# Patient Record
Sex: Male | Born: 1965 | Race: White | Hispanic: No | Marital: Married | Smoking: Former smoker
Health system: Southern US, Community
[De-identification: ages and names within clinical notes are randomized; demographics above are authoritative.]

## PROBLEM LIST (undated history)

## (undated) DIAGNOSIS — Z720 Tobacco use: Secondary | ICD-10-CM

## (undated) DIAGNOSIS — E291 Testicular hypofunction: Secondary | ICD-10-CM

## (undated) DIAGNOSIS — T7840XA Allergy, unspecified, initial encounter: Secondary | ICD-10-CM

## (undated) DIAGNOSIS — N529 Male erectile dysfunction, unspecified: Secondary | ICD-10-CM

## (undated) DIAGNOSIS — R011 Cardiac murmur, unspecified: Secondary | ICD-10-CM

## (undated) DIAGNOSIS — I519 Heart disease, unspecified: Secondary | ICD-10-CM

## (undated) DIAGNOSIS — J45909 Unspecified asthma, uncomplicated: Secondary | ICD-10-CM

## (undated) DIAGNOSIS — E119 Type 2 diabetes mellitus without complications: Secondary | ICD-10-CM

## (undated) DIAGNOSIS — G4733 Obstructive sleep apnea (adult) (pediatric): Secondary | ICD-10-CM

## (undated) DIAGNOSIS — I10 Essential (primary) hypertension: Secondary | ICD-10-CM

## (undated) DIAGNOSIS — F419 Anxiety disorder, unspecified: Secondary | ICD-10-CM

## (undated) DIAGNOSIS — K219 Gastro-esophageal reflux disease without esophagitis: Secondary | ICD-10-CM

## (undated) HISTORY — DX: Cardiac murmur, unspecified: R01.1

## (undated) HISTORY — DX: Testicular hypofunction: E29.1

## (undated) HISTORY — DX: Unspecified asthma, uncomplicated: J45.909

## (undated) HISTORY — DX: Tobacco use: Z72.0

## (undated) HISTORY — DX: Gastro-esophageal reflux disease without esophagitis: K21.9

## (undated) HISTORY — DX: Essential (primary) hypertension: I10

## (undated) HISTORY — DX: Allergy, unspecified, initial encounter: T78.40XA

## (undated) HISTORY — DX: Heart disease, unspecified: I51.9

## (undated) HISTORY — PX: CARDIOVASCULAR STRESS TEST: SHX262

## (undated) HISTORY — DX: Anxiety disorder, unspecified: F41.9

## (undated) HISTORY — DX: Male erectile dysfunction, unspecified: N52.9

## (undated) HISTORY — DX: Obstructive sleep apnea (adult) (pediatric): G47.33

## (undated) HISTORY — DX: Type 2 diabetes mellitus without complications: E11.9

---

## 2001-11-06 ENCOUNTER — Emergency Department (HOSPITAL_COMMUNITY): Admission: EM | Admit: 2001-11-06 | Discharge: 2001-11-06 | Payer: Self-pay

## 2004-06-07 HISTORY — PX: US ECHOCARDIOGRAPHY: HXRAD669

## 2009-03-04 ENCOUNTER — Emergency Department (HOSPITAL_COMMUNITY): Admission: EM | Admit: 2009-03-04 | Discharge: 2009-03-04 | Payer: Self-pay | Admitting: Emergency Medicine

## 2011-01-14 ENCOUNTER — Emergency Department (HOSPITAL_COMMUNITY): Payer: No Typology Code available for payment source

## 2011-01-14 ENCOUNTER — Emergency Department (HOSPITAL_COMMUNITY)
Admission: EM | Admit: 2011-01-14 | Discharge: 2011-01-15 | Disposition: A | Discharge status: Home / Self Care | Payer: No Typology Code available for payment source | Attending: Emergency Medicine | Admitting: Emergency Medicine

## 2011-01-14 DIAGNOSIS — S139XXA Sprain of joints and ligaments of unspecified parts of neck, initial encounter: Secondary | ICD-10-CM | POA: Insufficient documentation

## 2011-01-14 DIAGNOSIS — IMO0002 Reserved for concepts with insufficient information to code with codable children: Secondary | ICD-10-CM | POA: Insufficient documentation

## 2011-01-14 DIAGNOSIS — Y92410 Unspecified street and highway as the place of occurrence of the external cause: Secondary | ICD-10-CM | POA: Insufficient documentation
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
## 2014-05-11 DIAGNOSIS — K2100 Gastro-esophageal reflux disease with esophagitis, without bleeding: Secondary | ICD-10-CM | POA: Insufficient documentation

## 2014-05-11 LAB — LIPID PANEL
Cholesterol: 122 mg/dL (ref 0–200)
HDL: 43 mg/dL (ref 35–70)
LDL Cholesterol: 73 mg/dL
LDL/HDL RATIO: 2.8
TRIGLYCERIDES: 57 mg/dL (ref 40–160)

## 2014-05-11 LAB — PSA: PSA: 0.48

## 2014-05-11 LAB — HEMOGLOBIN A1C: HEMOGLOBIN A1C: 5.1 % (ref 4.0–6.0)

## 2014-05-21 ENCOUNTER — Telehealth: Payer: Self-pay | Admitting: Cardiovascular Disease

## 2014-05-21 NOTE — Telephone Encounter (Signed)
Closed encounter °

## 2014-07-01 ENCOUNTER — Ambulatory Visit: Payer: Self-pay | Admitting: Cardiovascular Disease

## 2014-07-06 ENCOUNTER — Encounter: Payer: Self-pay | Admitting: Cardiovascular Disease

## 2014-07-06 ENCOUNTER — Ambulatory Visit (INDEPENDENT_AMBULATORY_CARE_PROVIDER_SITE_OTHER): Payer: Managed Care, Other (non HMO) | Admitting: Cardiovascular Disease

## 2014-07-06 VITALS — BP 130/93 | HR 77 | Resp 16 | Ht 72.0 in | Wt 280.4 lb

## 2014-07-06 DIAGNOSIS — R079 Chest pain, unspecified: Secondary | ICD-10-CM

## 2014-07-06 DIAGNOSIS — R002 Palpitations: Secondary | ICD-10-CM

## 2014-07-06 DIAGNOSIS — E669 Obesity, unspecified: Secondary | ICD-10-CM

## 2014-07-06 DIAGNOSIS — Z720 Tobacco use: Secondary | ICD-10-CM | POA: Insufficient documentation

## 2014-07-06 NOTE — Patient Instructions (Signed)
Your physician has requested that you have en exercise [facility]. For further information please visit [email or web address] Please follow instruction sheet, as given.  Dr. Royann Shiversroitoru recommends that you schedule a follow-up appointment in: AS NEEDED.

## 2014-07-06 NOTE — Progress Notes (Signed)
Patient ID: George Brock, male   DOB: 11/03/1965, 48 y.o.   MRN: [id number]     Reason for office visit Evaluation for chest pain  Link George Brock is a 48 year old man who is a long-time smoker and has a family history of CAD. He has recently developed chest discomfort that appears to occur during intense physical activity and improves with rest. It has been going on for couple of months. Symptoms are never severe, what are definitely noticeable compared to a year ago. He has no other cardiovascular complaints. He uses a sildenafil intermittently for erectile dysfunction. He does not have hypertension, diabetes mellitus or hypercholesterolemia. He previously successfully quit smoking by using a vape device, but went back to smoking cigarettes when his fianc abruptly left him.  He underwent a nuclear stress test 10 years ago that was a normal study. His echo was also normal at that time. At that time his baseline ECG was normal. Today's electrocardiogram shows subtle abnormalities. There is borderline voltage for LVH, the QRS is borderline prolonged at 104 ms and there is subtle ST segment depression and T-wave inversion in leads 1 and aVL. The precordial transition is delayed until lead V5, but there are no Q waves.  Allergies  Allergen Reactions  . Codeine Other (See Comments)    Stomach upset    Current Outpatient Prescriptions  Medication Sig Dispense Refill  . albuterol (PROVENTIL HFA;VENTOLIN HFA) 108 (90 BASE) MCG/ACT inhaler Inhale 1-2 puffs into the lungs as needed for wheezing or shortness of breath.      . ALPRAZolam (XANAX XR) 0.5 MG 24 hr tablet Take 0.5 mg by mouth as needed for anxiety.      . Cetirizine HCl (ZYRTEC ALLERGY PO) Take by mouth daily.      . pantoprazole (PROTONIX) 40 MG tablet Take 40 mg by mouth daily.      . sildenafil (VIAGRA) 25 MG tablet Take 25 mg by mouth as needed for erectile dysfunction.       No current facility-administered medications for this visit.     Past Medical History  Diagnosis Date  . Erectile dysfunction   . GERD (gastroesophageal reflux disease)   . Asthma   . Tobacco abuse     History reviewed. No pertinent past surgical history.  Family History  Problem Relation Age of Onset  . CAD Mother     History   Social History  . Marital Status: Divorced    Spouse Name: N/A    Number of Children: N/A  . Years of Education: N/A   Occupational History  . Not on file.   Social History Main Topics  . Smoking status: Current Some Day Smoker -- 1.00 packs/day    Types: Cigarettes  . Smokeless tobacco: Not on file  . Alcohol Use: 0.5 - 1.0 oz/week    1-2 drink(s) per week  . Drug Use: No  . Sexual Activity: Not on file   Other Topics Concern  . Not on file   Social History Narrative  . No narrative on file    Review of systems: The patient specifically denies any chest pain at rest, dyspnea at rest or with exertion, orthopnea, paroxysmal nocturnal dyspnea, syncope, palpitations, focal neurological deficits, intermittent claudication, lower extremity edema, unexplained weight gain, cough, hemoptysis or wheezing.  The patient also denies abdominal pain, nausea, vomiting, dysphagia, diarrhea, constipation, polyuria, polydipsia, dysuria, hematuria, frequency, urgency, abnormal bleeding or bruising, fever, chills, unexpected weight changes, mood swings, change in skin or hair  texture, change in voice quality, auditory or visual problems, allergic reactions or rashes, new musculoskeletal complaints other than usual "aches and pains".   PHYSICAL EXAM BP 130/93  Pulse 77  Resp 16  Ht 6' (1.829 m)  Wt 127.189 kg (280 lb 6.4 oz)  BMI 38.02 kg/m2 Check blood pressure was 140/88 mm Hg General: Alert, oriented x3, no distress Head: no evidence of trauma, PERRL, EOMI, no exophtalmos or lid lag, no myxedema, no xanthelasma; normal ears, nose and oropharynx Neck: normal jugular venous pulsations and no hepatojugular  reflux; brisk carotid pulses without delay and no carotid bruits Chest: clear to auscultation, no signs of consolidation by percussion or palpation, normal fremitus, symmetrical and full respiratory excursions Cardiovascular: normal position and quality of the apical impulse, regular rhythm, normal first and second heart sounds, no murmurs, rubs or gallops Abdomen: no tenderness or distention, no masses by palpation, no abnormal pulsatility or arterial bruits, normal bowel sounds, no hepatosplenomegaly Extremities: no clubbing, cyanosis or edema; 2+ radial, ulnar and brachial pulses bilaterally; 2+ right femoral, posterior tibial and dorsalis pedis pulses; 2+ left femoral, posterior tibial and dorsalis pedis pulses; no subclavian or femoral bruits Neurological: grossly nonfocal   EKG: Normal sinus rhythm, minor IVCD (QRS 104 ms), ST depression and T-wave inversion in leads 1 and aVL  Lipid Panel  No results found for this basename: chol,  trig,  hdl,  cholhdl,  vldl,  ldlcalc,  ldldirect    BMET No results found for this basename: na,  k,  cl,  co2,  glucose,  bun,  creatinine,  calcium,  gfrnonaa,  gfraa   The patient's reports that these labs were normal when checked in late August.  ASSESSMENT AND PLAN  Eddie has chest discomfort symptoms that could represent exertional angina, although there are not completely typical. He has an abnormal electrocardiogram, change from 2005. His coronary risk factor profile is low to intermediate risk. I've recommended that he have a stress nuclear myocardial perfusion study. Weight loss is recommended. Smoking cessation is very strongly recommended. His blood pressure is borderline and should be carefully monitored. Sodium restriction as recommended.  Orders Placed This Encounter  Procedures  . Myocardial Perfusion Imaging  . EKG 12-Lead   Meds ordered this encounter  Medications  . pantoprazole (PROTONIX) 40 MG tablet    Sig: Take 40 mg by mouth  daily.  . Cetirizine HCl (ZYRTEC ALLERGY PO)    Sig: Take by mouth daily.  Marland Kitchen. albuterol (PROVENTIL HFA;VENTOLIN HFA) 108 (90 BASE) MCG/ACT inhaler    Sig: Inhale 1-2 puffs into the lungs as needed for wheezing or shortness of breath.  . sildenafil (VIAGRA) 25 MG tablet    Sig: Take 25 mg by mouth as needed for erectile dysfunction.  Marland Kitchen. ALPRAZolam (XANAX XR) 0.5 MG 24 hr tablet    Sig: Take 0.5 mg by mouth as needed for anxiety.    Junious SilkROITORU,Numa Heatwole  Jesse Nosbisch, MD, [facility] [phone] office [phone] pager

## 2014-07-20 LAB — HM COLONOSCOPY

## 2014-07-27 ENCOUNTER — Telehealth: Payer: Self-pay

## 2014-07-27 ENCOUNTER — Encounter (HOSPITAL_COMMUNITY): Payer: Self-pay | Admitting: Pharmacy Technician

## 2014-07-27 ENCOUNTER — Ambulatory Visit (HOSPITAL_COMMUNITY): Payer: Managed Care, Other (non HMO) | Attending: Cardiovascular Disease | Admitting: Radiology

## 2014-07-27 ENCOUNTER — Ambulatory Visit (INDEPENDENT_AMBULATORY_CARE_PROVIDER_SITE_OTHER): Payer: Managed Care, Other (non HMO) | Admitting: Interventional Cardiology

## 2014-07-27 ENCOUNTER — Encounter: Payer: Self-pay | Admitting: Interventional Cardiology

## 2014-07-27 VITALS — BP 132/80 | HR 72 | Resp 16

## 2014-07-27 DIAGNOSIS — R9431 Abnormal electrocardiogram [ECG] [EKG]: Secondary | ICD-10-CM | POA: Insufficient documentation

## 2014-07-27 DIAGNOSIS — R9439 Abnormal result of other cardiovascular function study: Secondary | ICD-10-CM

## 2014-07-27 DIAGNOSIS — R002 Palpitations: Secondary | ICD-10-CM | POA: Insufficient documentation

## 2014-07-27 DIAGNOSIS — Z01812 Encounter for preprocedural laboratory examination: Secondary | ICD-10-CM

## 2014-07-27 DIAGNOSIS — R079 Chest pain, unspecified: Secondary | ICD-10-CM

## 2014-07-27 MED ORDER — TECHNETIUM TC 99M SESTAMIBI GENERIC - CARDIOLITE
11.0000 | Freq: Once | INTRAVENOUS | Status: AC | PRN
Start: 2014-07-27 — End: 2014-07-27
  Administered 2014-07-27: 11 via INTRAVENOUS

## 2014-07-27 MED ORDER — ASPIRIN EC 81 MG PO TBEC: 81.0000 mg | DELAYED_RELEASE_TABLET | Freq: Every day | ORAL | Status: DC

## 2014-07-27 MED ORDER — TECHNETIUM TC 99M SESTAMIBI GENERIC - CARDIOLITE
33.0000 | Freq: Once | INTRAVENOUS | Status: AC | PRN
  Administered 2014-07-27: 33 via INTRAVENOUS

## 2014-07-27 NOTE — Progress Notes (Signed)
Patient ID: George Brock, male   DOB: 09/29/1965, 48 y.o.   MRN: [id number]   Date: 07/27/2014 ID: George Hewdward G Oleksy, DOB 04/11/1966, MRN [id number] PCP: Herb GraysSPEAR, TAMMY, MD  Reason: Abnormal myocardial perfusion study  ASSESSMENT;  1. Abnormal myocardial perfusion study with both inferior and apical fixed perfusion defects 2. Exertional dyspnea, palpitations and limited exertional tolerance. In conjunction with the myocardial perfusion study, we need to exclude ischemic heart disease. 3. Cigarette smoking 4. Family history CAD  PLAN:  1. After reviewing the myocardial perfusion study performed earlier today (I am DOD for office), I spoke to the patient and we discussed the implications of the myocardial perfusion study. He could have diaphragm attenuation accounting for the inferior wall abnormality noted but apical defect likely represented a true perfusion abnormality. Given his risk factors (family history, obesity, tobacco use, and erectile dysfunction) I have recommended that he undergo diagnostic coronary angiography to define anatomy and determine if his exertional complaints are related to coronary artery disease. This is certainly suggestive by the abnormalities noted on the nuclear study.  2. Coronary angiography from the radial approach were discussed with the patient in detail including the risk of stroke, death, myocardial infarction, allergy, infection, bleeding, vessel injury, kidney injury, among other complications and accepted by the patient. He only have the procedure performed on Mondays. He is not willing to have the procedure done before next week. He will be set up to have the procedure with Dr. Daphene Jaegerom Kelly   SUBJECTIVE: George Hewdward G Kovich is a 48 y.o. male who is asymptomatic today. He just completed a stress Cardiolite study and images demonstrated apical and inferior wall perfusion abnormalities without appreciable ischemia. LV size and function appear normal. Because of  the abnormality, I was asked to see him in the stress lab. The patient is indeed asymptomatic post exercise. He walked greater than 7 minutes on the treadmill. I reviewed the note from Dr.Croitoru and he documented a history of chest discomfort which led to the perfusion study.. The patient does not describe chest discomfort today and states that it is not a consistent problem. He does admit to a sensation that his heart is racing associated with decreased exertional tolerance. The symptoms have been present for several months. Nothing has changed since he saw Dr. Salena Saner. He continues to smoke cigarettes. He denies orthopnea, PND, syncope, history of arrhythmia, and recurring episodes of chest pain.he denies claudication.   Allergies  Allergen Reactions  . Codeine Other (See Comments)    Stomach upset    Current Outpatient Prescriptions on File Prior to Visit  Medication Sig Dispense Refill  . albuterol (PROVENTIL HFA;VENTOLIN HFA) 108 (90 BASE) MCG/ACT inhaler Inhale 1-2 puffs into the lungs as needed for wheezing or shortness of breath.    . ALPRAZolam (XANAX XR) 0.5 MG 24 hr tablet Take 0.5 mg by mouth as needed for anxiety.    . Cetirizine HCl (ZYRTEC ALLERGY PO) Take 10 mg by mouth daily.     . pantoprazole (PROTONIX) 40 MG tablet Take 40 mg by mouth daily.    . sildenafil (VIAGRA) 25 MG tablet Take 25 mg by mouth as needed for erectile dysfunction.     No current facility-administered medications on file prior to visit.    Past Medical History  Diagnosis Date  . Erectile dysfunction   . GERD (gastroesophageal reflux disease)   . Asthma   . Tobacco abuse     No past surgical history on file.  History  Social History  . Marital Status: Divorced    Spouse Name: N/A    Number of Children: N/A  . Years of Education: N/A   Occupational History  . Not on file.   Social History Main Topics  . Smoking status: Current Some Day Smoker -- 1.00 packs/day    Types: Cigarettes  .  Smokeless tobacco: Not on file  . Alcohol Use: 0.5 - 1.0 oz/week    1-2 drink(s) per week  . Drug Use: No  . Sexual Activity: Not on file   Other Topics Concern  . Not on file   Social History Narrative  . No narrative on file    Family History  Problem Relation Age of Onset  . CAD Mother     ROS: continues to smoke heavily. He states that he takes doctors. He is willing to undergo evaluation. We discussed the appearance of this stress images he seems to understand the concern. No history of hypertension, hyperlipidemia, or other chronic illness. He does have erectile dysfunction. He uses Protonix for reflux.. Other systems negative for complaints.  OBJECTIVE: BP 130/70millimeters mercury, heart rate 68, obese, in no respiratory distress. General: No acute distress, an age is compatible with appearance HEENT: normal without jaundice or pallor Neck: JVD flat. Carotids 2+ and symmetric without bruits Chest: clear Cardiac: Murmur: none. Gallop: absent. Rhythm: normal. Other: normal Abdomen: Bruit: absent. Pulsation: absent Extremities: Edema: normal. Pulses: 2+ and symmetric Neuro: normal Psych: normal  ECG: performed as part of the stress test did not reveal any significant abnormality. 

## 2014-07-27 NOTE — Telephone Encounter (Signed)
Pt given Dr.Smith recommendation to start Asa 81mg  daily. Pt adv that cardiac cath is scheduled on 11/16 @7 :30am with Dr.Kelly @ [facility]. Pt will come to the office on 11/10 for pre cath labs. Pt given verbal pre-procedure instructions. Pt will pick up written instructions when he comes in for lab work. Pt verbalized understanding.

## 2014-07-27 NOTE — Patient Instructions (Signed)
Your physician recommends that you continue on your current medications as directed. Please refer to the Current Medication list given to you today.  Lab Today: Bmet, Cbc  Your physician has requested that you have a cardiac catheterization. Cardiac catheterization is used to diagnose and/or treat various heart conditions. Doctors may recommend this procedure for a number of different reasons. The most common reason is to evaluate chest pain. Chest pain can be a symptom of coronary artery disease (CAD), and cardiac catheterization can show whether plaque is narrowing or blocking your heart's arteries. This procedure is also used to evaluate the valves, as well as measure the blood flow and oxygen levels in different parts of your heart. For further information please visit [email or web address]. Please follow instruction sheet, as given.   

## 2014-07-27 NOTE — Progress Notes (Signed)
[facility] SITE [facility] NUCLEAR MED [address] [place], [place] [address] [phone]    Cardiology Nuclear Med Study  George Brock is a 48 y.o. male     MRN : [id number]     DOB: 02/17/1966  Procedure Date: 07/27/2014  Nuclear Med Background Indication for Stress Test:  Evaluation for Ischemia and Abnormal EKG History:  MPI: 10 yrs ago NL Cardiac Risk Factors: none  Symptoms:  Chest Pain and Palpitations   Nuclear Pre-Procedure Caffeine/Decaff Intake:  None NPO After: 9:30pm   Lungs:  clear O2 Sat: 97% on room air. IV 0.9% NS with Angio Cath:  22g  IV Site: R Hand  IV Started by:  Cathlyn Parsonsynthia Hasspacher, RN  Chest Size (in):  54" Cup Size: n/a  Height: 6' (1.829 m)  Weight:  282 lb (127.914 kg)  BMI:  Body mass index is 38.24 kg/(m^2). Tech Comments:  Pictures checked, DOD H.Katrinka BlazingSmith MD consulted about his test. He came and spoke with the patient about having a cardiac catherization. S.Williams EMTP    Nuclear Med Study 1 or 2 day study: 1 day  Stress Test Type:  Stress  Reading MD: n/a  Order Authorizing Provider:  Mihai Croitoru,MD  Resting Radionuclide: Technetium 9540m Sestamibi  Resting Radionuclide Dose: 11.0 mCi   Stress Radionuclide:  Technetium 3840m Sestamibi  Stress Radionuclide Dose: 33.0 mCi           Stress Protocol Rest HR: 64 Stress HR: 166  Rest BP: 118/80 Stress BP: 167/83  Exercise Time (min): 7:15 METS: 8.90   Predicted Max HR: 170 bpm % Max HR: 97.65 bpm Rate Pressure Product: [id number]   Dose of Adenosine (mg):  n/a Dose of Lexiscan: n/a mg  Dose of Atropine (mg): n/a Dose of Dobutamine: n/a mcg/kg/min (at max HR)  Stress Test Technologist: Milana NaSabrina Williams, EMT-P  Nuclear Technologist:  Kerby NoraElzbieta Kubak, CNMT     Rest Procedure:  Myocardial perfusion imaging was performed at rest 45 minutes following the intravenous administration of Technetium 2340m Sestamibi. Rest ECG: sinus bradycardia rate 59 with poor R-wave progression.  Stress  Procedure:  The patient exercised on the treadmill utilizing the Bruce Protocol for 7:15 minutes. The patient stopped due to sob, fatigue and denied any chest pain.  Technetium 5840m Sestamibi was injected at peak exercise and myocardial perfusion imaging was performed after a brief delay. Stress ECG: During stress, QRS complex slightly widened, same morphology/left bundle branch block like. No significant ST segment depression identified.  QPS Raw Data Images:  Normal; no motion artifact; normal heart/lung ratio. Stress Images:  there is decreased radiotracer uptake in the apical distribution seen at both rest and stress. There is also a fixed defect along the inferoseptal/basal inferoseptal region seen at both rest and stress, possible old infarct. No areas of ischemia identified. Rest Images:  as above Subtraction (SDS):  No evidence of ischemia. Transient Ischemic Dilatation (Normal <1.22):  0.94 Lung/Heart Ratio (Normal <0.45):  0.38  Quantitative Gated Spect Images QGS EDV:  137 ml QGS ESV:  63 ml  Impression Exercise Capacity:  Good exercise capacity. BP Response:  Normal blood pressure response. Clinical Symptoms:  shortness of breath ECG Impression:  No significant ST segment change suggestive of ischemia. Exercise-induced left bundle branch block noted. 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  LV Ejection Fraction: 54%.  LV Wall Motion:  NL LV Function; NL Wall Motion  Donato SchultzSKAINS, MARK, MD

## 2014-07-28 ENCOUNTER — Other Ambulatory Visit (INDEPENDENT_AMBULATORY_CARE_PROVIDER_SITE_OTHER): Payer: Managed Care, Other (non HMO)

## 2014-07-28 DIAGNOSIS — Z01812 Encounter for preprocedural laboratory examination: Secondary | ICD-10-CM

## 2014-07-29 LAB — BASIC METABOLIC PANEL
BUN: 14 mg/dL (ref 6–23)
CHLORIDE: 103 meq/L (ref 96–112)
CO2: 28 mEq/L (ref 19–32)
CREATININE: 0.9 mg/dL (ref 0.4–1.5)
Calcium: 9.3 mg/dL (ref 8.4–10.5)
GFR: 91.34 mL/min (ref >60.00)
Glucose, Bld: 83 mg/dL (ref 70–99)
POTASSIUM: 4.1 meq/L (ref 3.5–5.1)
Sodium: 136 mEq/L (ref 135–145)

## 2014-07-29 LAB — CBC WITH DIFFERENTIAL/PLATELET
Basophils Absolute: 0 10*3/uL (ref 0.0–0.1)
Basophils Relative: 0.4 % (ref 0.0–3.0)
EOS PCT: 4.2 % (ref 0.0–5.0)
Eosinophils Absolute: 0.3 10*3/uL (ref 0.0–0.7)
HEMATOCRIT: 44.7 % (ref 39.0–52.0)
Hemoglobin: 15.2 g/dL (ref 13.0–17.0)
Lymphocytes Relative: 28.6 % (ref 12.0–46.0)
Lymphs Abs: 2.1 10*3/uL (ref 0.7–4.0)
MCHC: 34.1 g/dL (ref 30.0–36.0)
MCV: 95.8 fl (ref 78.0–100.0)
MONO ABS: 0.5 10*3/uL (ref 0.1–1.0)
Monocytes Relative: 6.5 % (ref 3.0–12.0)
NEUTROS PCT: 60.3 % (ref 43.0–77.0)
Neutro Abs: 4.5 10*3/uL (ref 1.4–7.7)
Platelets: 201 10*3/uL (ref 150.0–400.0)
RBC: 4.67 Mil/uL (ref 4.22–5.81)
RDW: 13 % (ref 11.5–15.5)
WBC: 7.4 10*3/uL (ref 4.0–10.5)

## 2014-07-31 ENCOUNTER — Telehealth: Payer: Self-pay

## 2014-07-31 NOTE — Telephone Encounter (Signed)
Pt aware of lab results.labs ok. Pt verbalized understanding.

## 2014-07-31 NOTE — Telephone Encounter (Signed)
-----   Message from Lesleigh NoeHenry W Smith III, MD sent at 07/29/2014  6:37 PM EST ----- Labs are okay.

## 2014-08-03 ENCOUNTER — Encounter (HOSPITAL_COMMUNITY)
Admission: RE | Disposition: A | Discharge status: Home / Self Care | Payer: Self-pay | Source: Ambulatory Visit | Attending: Cardiovascular Disease

## 2014-08-03 ENCOUNTER — Ambulatory Visit (HOSPITAL_COMMUNITY)
Admission: RE | Admit: 2014-08-03 | Discharge: 2014-08-03 | Disposition: A | Discharge status: Home / Self Care | Payer: Managed Care, Other (non HMO) | Source: Ambulatory Visit | Attending: Cardiovascular Disease | Admitting: Cardiovascular Disease

## 2014-08-03 DIAGNOSIS — K219 Gastro-esophageal reflux disease without esophagitis: Secondary | ICD-10-CM | POA: Diagnosis not present

## 2014-08-03 DIAGNOSIS — F17210 Nicotine dependence, cigarettes, uncomplicated: Secondary | ICD-10-CM | POA: Insufficient documentation

## 2014-08-03 DIAGNOSIS — R9439 Abnormal result of other cardiovascular function study: Secondary | ICD-10-CM | POA: Insufficient documentation

## 2014-08-03 DIAGNOSIS — J45909 Unspecified asthma, uncomplicated: Secondary | ICD-10-CM | POA: Insufficient documentation

## 2014-08-03 DIAGNOSIS — Z8249 Family history of ischemic heart disease and other diseases of the circulatory system: Secondary | ICD-10-CM | POA: Insufficient documentation

## 2014-08-03 DIAGNOSIS — E669 Obesity, unspecified: Secondary | ICD-10-CM

## 2014-08-03 DIAGNOSIS — Z720 Tobacco use: Secondary | ICD-10-CM

## 2014-08-03 HISTORY — PX: LEFT HEART CATHETERIZATION WITH CORONARY ANGIOGRAM: SHX5451

## 2014-08-03 LAB — PROTIME-INR
INR: 1.01 (ref 0.00–1.49)
PROTHROMBIN TIME: 13.4 s (ref 11.6–15.2)

## 2014-08-03 SURGERY — LEFT HEART CATHETERIZATION WITH CORONARY ANGIOGRAM
Anesthesia: LOCAL

## 2014-08-03 MED ORDER — HEPARIN SODIUM (PORCINE) 1000 UNIT/ML IJ SOLN
INTRAMUSCULAR | Status: AC
  Filled 2014-08-03: qty 1

## 2014-08-03 MED ORDER — ASPIRIN 81 MG PO CHEW
CHEWABLE_TABLET | ORAL | Status: AC
Start: 2014-08-03 — End: 2014-08-03
  Administered 2014-08-03: 81 mg via ORAL
  Filled 2014-08-03: qty 1

## 2014-08-03 MED ORDER — SODIUM CHLORIDE 0.9 % IJ SOLN: 3.0000 mL | INTRAMUSCULAR | Status: DC | PRN

## 2014-08-03 MED ORDER — NITROGLYCERIN 1 MG/10 ML FOR IR/CATH LAB
INTRA_ARTERIAL | Status: AC
  Filled 2014-08-03: qty 10

## 2014-08-03 MED ORDER — ASPIRIN EC 81 MG PO TBEC: 81.0000 mg | DELAYED_RELEASE_TABLET | Freq: Every day | ORAL | Status: DC

## 2014-08-03 MED ORDER — LIDOCAINE HCL (PF) 1 % IJ SOLN
INTRAMUSCULAR | Status: AC
  Filled 2014-08-03: qty 30

## 2014-08-03 MED ORDER — ONDANSETRON HCL 4 MG/2ML IJ SOLN: 4.0000 mg | Freq: Four times a day (QID) | INTRAMUSCULAR | Status: DC | PRN

## 2014-08-03 MED ORDER — MIDAZOLAM HCL 2 MG/2ML IJ SOLN
INTRAMUSCULAR | Status: AC
  Filled 2014-08-03: qty 2

## 2014-08-03 MED ORDER — SODIUM CHLORIDE 0.9 % IJ SOLN: 3.0000 mL | Freq: Two times a day (BID) | INTRAMUSCULAR | Status: DC

## 2014-08-03 MED ORDER — SODIUM CHLORIDE 0.9 % IV SOLN: INTRAVENOUS | Status: DC

## 2014-08-03 MED ORDER — HEPARIN (PORCINE) IN NACL 2-0.9 UNIT/ML-% IJ SOLN
INTRAMUSCULAR | Status: AC
  Filled 2014-08-03: qty 1500

## 2014-08-03 MED ORDER — FENTANYL CITRATE 0.05 MG/ML IJ SOLN
INTRAMUSCULAR | Status: AC
  Filled 2014-08-03: qty 2

## 2014-08-03 MED ORDER — SODIUM CHLORIDE 0.9 % IV SOLN: 250.0000 mL | INTRAVENOUS | Status: DC | PRN

## 2014-08-03 MED ORDER — SODIUM CHLORIDE 0.9 % IV SOLN
INTRAVENOUS | Status: DC
  Administered 2014-08-03: 07:00:00 via INTRAVENOUS

## 2014-08-03 MED ORDER — ACETAMINOPHEN 325 MG PO TABS: 650.0000 mg | ORAL_TABLET | ORAL | Status: DC | PRN

## 2014-08-03 MED ORDER — VERAPAMIL HCL 2.5 MG/ML IV SOLN
INTRAVENOUS | Status: AC
  Filled 2014-08-03: qty 2

## 2014-08-03 MED ORDER — ASPIRIN 81 MG PO CHEW
81.0000 mg | CHEWABLE_TABLET | ORAL | Status: AC
  Administered 2014-08-03: 81 mg via ORAL

## 2014-08-03 NOTE — H&P (View-Only) (Signed)
Patient ID: George Brock, male   DOB: 09/29/1965, 48 y.o.   MRN: [id number]   Date: 07/27/2014 ID: George Brock, DOB 04/11/1966, MRN [id number] PCP: Herb GraysSPEAR, TAMMY, MD  Reason: Abnormal myocardial perfusion study  ASSESSMENT;  1. Abnormal myocardial perfusion study with both inferior and apical fixed perfusion defects 2. Exertional dyspnea, palpitations and limited exertional tolerance. In conjunction with the myocardial perfusion study, we need to exclude ischemic heart disease. 3. Cigarette smoking 4. Family history CAD  PLAN:  1. After reviewing the myocardial perfusion study performed earlier today (I am DOD for office), I spoke to the patient and we discussed the implications of the myocardial perfusion study. He could have diaphragm attenuation accounting for the inferior wall abnormality noted but apical defect likely represented a true perfusion abnormality. Given his risk factors (family history, obesity, tobacco use, and erectile dysfunction) I have recommended that he undergo diagnostic coronary angiography to define anatomy and determine if his exertional complaints are related to coronary artery disease. This is certainly suggestive by the abnormalities noted on the nuclear study.  2. Coronary angiography from the radial approach were discussed with the patient in detail including the risk of stroke, death, myocardial infarction, allergy, infection, bleeding, vessel injury, kidney injury, among other complications and accepted by the patient. He only have the procedure performed on Mondays. He is not willing to have the procedure done before next week. He will be set up to have the procedure with Dr. Daphene Jaegerom Kelly   SUBJECTIVE: George Brock is a 48 y.o. male who is asymptomatic today. He just completed a stress Cardiolite study and images demonstrated apical and inferior wall perfusion abnormalities without appreciable ischemia. LV size and function appear normal. Because of  the abnormality, I was asked to see him in the stress lab. The patient is indeed asymptomatic post exercise. He walked greater than 7 minutes on the treadmill. I reviewed the note from Dr.Croitoru and he documented a history of chest discomfort which led to the perfusion study.. The patient does not describe chest discomfort today and states that it is not a consistent problem. He does admit to a sensation that his heart is racing associated with decreased exertional tolerance. The symptoms have been present for several months. Nothing has changed since he saw Dr. Salena Saner. He continues to smoke cigarettes. He denies orthopnea, PND, syncope, history of arrhythmia, and recurring episodes of chest pain.he denies claudication.   Allergies  Allergen Reactions  . Codeine Other (See Comments)    Stomach upset    Current Outpatient Prescriptions on File Prior to Visit  Medication Sig Dispense Refill  . albuterol (PROVENTIL HFA;VENTOLIN HFA) 108 (90 BASE) MCG/ACT inhaler Inhale 1-2 puffs into the lungs as needed for wheezing or shortness of breath.    . ALPRAZolam (XANAX XR) 0.5 MG 24 hr tablet Take 0.5 mg by mouth as needed for anxiety.    . Cetirizine HCl (ZYRTEC ALLERGY PO) Take 10 mg by mouth daily.     . pantoprazole (PROTONIX) 40 MG tablet Take 40 mg by mouth daily.    . sildenafil (VIAGRA) 25 MG tablet Take 25 mg by mouth as needed for erectile dysfunction.     No current facility-administered medications on file prior to visit.    Past Medical History  Diagnosis Date  . Erectile dysfunction   . GERD (gastroesophageal reflux disease)   . Asthma   . Tobacco abuse     No past surgical history on file.  History  Social History  . Marital Status: Divorced    Spouse Name: N/A    Number of Children: N/A  . Years of Education: N/A   Occupational History  . Not on file.   Social History Main Topics  . Smoking status: Current Some Day Smoker -- 1.00 packs/day    Types: Cigarettes  .  Smokeless tobacco: Not on file  . Alcohol Use: 0.5 - 1.0 oz/week    1-2 drink(s) per week  . Drug Use: No  . Sexual Activity: Not on file   Other Topics Concern  . Not on file   Social History Narrative  . No narrative on file    Family History  Problem Relation Age of Onset  . CAD Mother     ROS: continues to smoke heavily. He states that he takes doctors. He is willing to undergo evaluation. We discussed the appearance of this stress images he seems to understand the concern. No history of hypertension, hyperlipidemia, or other chronic illness. He does have erectile dysfunction. He uses Protonix for reflux.. Other systems negative for complaints.  OBJECTIVE: BP 130/1770millimeters mercury, heart rate 68, obese, in no respiratory distress. General: No acute distress, an age is compatible with appearance HEENT: normal without jaundice or pallor Neck: JVD flat. Carotids 2+ and symmetric without bruits Chest: clear Cardiac: Murmur: none. Gallop: absent. Rhythm: normal. Other: normal Abdomen: Bruit: absent. Pulsation: absent Extremities: Edema: normal. Pulses: 2+ and symmetric Neuro: normal Psych: normal  ECG: performed as part of the stress test did not reveal any significant abnormality.

## 2014-08-03 NOTE — Interval H&P Note (Signed)
Cath Lab Visit (complete for each Cath Lab visit)  Clinical Evaluation Leading to the Procedure:   ACS: No.  Non-ACS:    Anginal Classification: CCS II  Anti-ischemic medical therapy: No Therapy  Non-Invasive Test Results: Intermediate-risk stress test findings: cardiac mortality 1-3%/year  Prior CABG: No previous CABG      History and Physical Interval Note:  08/03/2014 7:52 AM  George Brock  has presented today for surgery, with the diagnosis of abnormal nuc  The various methods of treatment have been discussed with the patient and family. After consideration of risks, benefits and other options for treatment, the patient has consented to  Procedure(s): LEFT HEART CATHETERIZATION WITH CORONARY ANGIOGRAM (N/A) as a surgical intervention .  The patient's history has been reviewed, patient examined, no change in status, stable for surgery.  I have reviewed the patient's chart and labs.  Questions were answered to the patient's satisfaction.     KELLY,THOMAS A

## 2014-08-03 NOTE — CV Procedure (Signed)
George Brock is a 48 y.o. male   [id number]  [id number] LOCATION:  FACILITY: [facility]  PHYSICIAN: Lennette Biharihomas A. Kelly, MD, [facility] 06/22/1966   DATE OF PROCEDURE:  08/03/2014     CARDIAC CATHETERIZATION    HISTORY:    [name] is a 48 year old male who has a long-standing history of tobacco use, recent development of palpitations and increasing fatigue who had undergone nuclear perfusion imaging which revealed both inferior and apical fixed perfusion defects.  In light of his cardiac risk factor profile he is now referred for definitive diagnostic cardiac catheterization and possible percutaneous coronary intervention.   PROCEDURE:  Left heart catheterization via the right radial artery: Coronary angiography, left ventriculography.  The patient was brought to the second floor [facility] cath lab in the fasting state. The patient was premedicated with Versed 3 mg and fentanyl 75 mcg. A right radial approach was utilized after an Allen's test verified adequate circulation. The right radial artery was punctured via the Seldinger technique, and a 6 Jamaica[place] Glidesheath Slender was inserted without difficulty.  A radial cocktail consisting of Verapamil, IV nitroglycerin, and lidocaine was administered. Weight adjusted heparin was administered. A safety J wire was advanced into the ascending aorta. Diagnostic catheterization was done with 5 [place]French JR 4 and TIG 4.0 catheters. A 5 French pigtail catheter was used for left ventriculography. A TR radial band was applied for hemostasis. The patient left the catheterization laboratory in stable condition.   HEMODYNAMICS:   Central Aorta: 100/67  Left Ventricle: 100/9  ANGIOGRAPHY:   The left main coronary artery was a very short  angiographically normal vessel that immediately bifurcated into the LAD and left circumflex coronary artery.   The LAD was angiographically normal and gave rise to 2 major diagonal vessels and several septal  perforating arteries. The vessel extended to the LV apex.   The left circumflex coronary artery was a large dominant normal vessel which gave rise to two obtuse marginal branch.   The RCA was an angiographically normal nondominant vessel.  Left ventriculography revealed normal global LV contractility without focal segmental wall motion abnormalities. There was no evidence for mitral regurgitation. The ejection fraction was 55-60%.   Total contrast used: 80 cc Omnipaque  IMPRESSION:  Normal coronary arteries.  Normal left ventricular function.   RECOMMENDATION:  Smoking cessation was strongly advised.   Lennette Biharihomas A. Kelly, MD, [facility] 08/03/2014 8:41 AM

## 2014-08-03 NOTE — Discharge Instructions (Signed)
Radial Site Care Refer to this sheet in the next few weeks. These instructions provide you with information on caring for yourself after your procedure. Your caregiver may also give you more specific instructions. Your treatment has been planned according to current medical practices, but problems sometimes occur. Call your caregiver if you have any problems or questions after your procedure. HOME CARE INSTRUCTIONS  You may shower the day after the procedure.Remove the bandage (dressing) and gently wash the site with plain soap and water.Gently pat the site dry.  Do not apply powder or lotion to the site.  Do not submerge the affected site in water for 3 to 5 days.  Inspect the site at least twice daily.  Do not flex or bend the affected arm for 24 hours.  No lifting over 5 pounds (2.3 kg) for 5 days after your procedure.  Do not drive home if you are discharged the same day of the procedure. Have someone else drive you.  You may drive 24 hours after the procedure unless otherwise instructed by your caregiver.  Do not operate machinery or power tools for 24 hours.  A responsible adult should be with you for the first 24 hours after you arrive home. What to expect:  Any bruising will usually fade within 1 to 2 weeks.  Blood that collects in the tissue (hematoma) may be painful to the touch. It should usually decrease in size and tenderness within 1 to 2 weeks. SEEK IMMEDIATE MEDICAL CARE IF:  You have unusual pain at the radial site.  You have redness, warmth, swelling, or pain at the radial site.  You have drainage (other than a small amount of blood on the dressing).  You have chills.  You have a fever or persistent symptoms for more than 72 hours.  You have a fever and your symptoms suddenly get worse.  Your arm becomes pale, cool, tingly, or numb.  You have heavy bleeding from the site. Hold pressure on the site. Document Released: 10/07/2010 Document Revised:  11/27/2011 Document Reviewed: 10/07/2010 [facility] Patient Information 2015 [place], [place]. This information is not intended to replace advice given to you by your health care provider. Make sure you discuss any questions you have with your health care provider.                 Return To Work _[name]__ was treated at our facility. INJURY OR ILLNESS WAS: _____ Work-related __X___ Not work-related _____ Undetermined if work-related RETURN TO WORK  Employee may return to work on: Monday 08/10/2014  Employee may return to modified work on: ____________________ WORK ACTIVITY RESTRICTIONS Work activities not tolerated include: _____ Bending _____ Prolonged sitting _____ Lifting _____ Squatting _____ Prolonged standing _____ Otelia Limeslimbing _____ Reaching _____ Pushing and pulling _____ Walking _____ Other ____________________ Show this Return to Work statement to Proofreaderyour supervisor at work as soon as possible. Your employer should be aware of your condition and can help with the necessary work activity restrictions. If you wish to return to work sooner than the date above, or if you have further problems which make it difficult for you to return at that time, please call us or your caregiver. __Dr. Maisie Fushomas Kelly____ Physician Name (Printed) _________________________________________ Physician Signature  _11/16/2015___ Date Document Released: 09/04/2005 Document Revised: 11/27/2011 Document Reviewed: 02/19/2007 [facility] RoadExitCare Patient Information 2015 [place], [place]. This information is not intended to replace advice given to you by your health care provider. Make sure you discuss any questions you have with your  health care provider. ° ° °

## 2014-08-04 ENCOUNTER — Telehealth: Payer: Self-pay | Admitting: Cardiovascular Disease

## 2014-08-04 NOTE — Telephone Encounter (Signed)
Patient stated he was wanting to know why Myoview abnormal and cardiac cath normal.Advised myoview indicated he might have a blockage.Exercised induced ekg showed a LBBB.Follow up cath appointment scheduled with Dr.Croitoru 08/17/14 at 4:15 pm.

## 2014-08-04 NOTE — Telephone Encounter (Signed)
Pt had cath yesterday,says he have some questions.

## 2014-08-04 NOTE — Telephone Encounter (Signed)
Returned Call .Marland Kitchen. Please call .Marland Kitchen.   Thanks

## 2014-08-04 NOTE — Telephone Encounter (Signed)
Returned call to patient he stated he had a cardiac cath yesterday 08/03/14 and cath was normal.Stated he was told myoview abnormal and he need a cath.Patient was told myoview ekg rev

## 2014-08-04 NOTE — Telephone Encounter (Signed)
Returned call to patient no answer.LMTC. 

## 2014-08-11 ENCOUNTER — Encounter: Payer: Self-pay | Admitting: *Deleted

## 2014-08-17 ENCOUNTER — Encounter: Payer: Self-pay | Admitting: Cardiovascular Disease

## 2014-08-17 ENCOUNTER — Ambulatory Visit (INDEPENDENT_AMBULATORY_CARE_PROVIDER_SITE_OTHER): Payer: Managed Care, Other (non HMO) | Admitting: Cardiovascular Disease

## 2014-08-17 VITALS — BP 132/102 | HR 80 | Ht 72.0 in | Wt 287.7 lb

## 2014-08-17 DIAGNOSIS — R9439 Abnormal result of other cardiovascular function study: Secondary | ICD-10-CM

## 2014-08-17 DIAGNOSIS — Z720 Tobacco use: Secondary | ICD-10-CM

## 2014-08-17 NOTE — Progress Notes (Signed)
Patient ID: George Brock, male   DOB: 01/06/1966, 48 y.o.   MRN: [id number]     Reason for office visit Follow-up after cardiac catheterization  Since I last saw George Brock in clinic, he underwent a treadmill exercise stress test with nuclear imaging. He developed a rate related intraventricular conduction delay, consistent with an atypical LBBB during exercise. The nuclear perfusion images were markedly abnormal with fixed defects seen along the inferior septum and the apical septum. He had an ejection fraction of 54% with normal wall motion. He subsequently underwent coronary angiography that showed normal coronary arteries. He is continuing his efforts to quit smoking, down to 4 cigarettes a day, substituting it with a vape. He feels well   Allergies  Allergen Reactions  . Codeine Other (See Comments)    Stomach upset    Current Outpatient Prescriptions  Medication Sig Dispense Refill  . albuterol (PROVENTIL HFA;VENTOLIN HFA) 108 (90 BASE) MCG/ACT inhaler Inhale 1-2 puffs into the lungs as needed for wheezing or shortness of breath.    . ALPRAZolam (XANAX XR) 0.5 MG 24 hr tablet Take 0.5 mg by mouth as needed for anxiety.    Marland Kitchen. aspirin EC 81 MG tablet Take 1 tablet (81 mg total) by mouth daily.    . Cetirizine HCl (ZYRTEC ALLERGY PO) Take 10 mg by mouth daily.     . pantoprazole (PROTONIX) 40 MG tablet Take 40 mg by mouth daily.    . sildenafil (VIAGRA) 25 MG tablet Take 25 mg by mouth as needed for erectile dysfunction.     No current facility-administered medications for this visit.    Past Medical History  Diagnosis Date  . Erectile dysfunction   . GERD (gastroesophageal reflux disease)   . Asthma   . Tobacco abuse     Past Surgical History  Procedure Laterality Date  . [place] echocardiography  06/07/2004  . Cardiovascular stress test      cardiolite    Family History  Problem Relation Age of Onset  . CAD Mother     History   Social History  . Marital Status: Divorced     Spouse Name: N/A    Number of Children: N/A  . Years of Education: N/A   Occupational History  . Not on file.   Social History Main Topics  . Smoking status: Current Some Day Smoker -- 1.00 packs/day    Types: Cigarettes  . Smokeless tobacco: Not on file  . Alcohol Use: 0.5 - 1.0 oz/week    1-2 drink(s) per week  . Drug Use: No  . Sexual Activity: Not on file   Other Topics Concern  . Not on file   Social History Narrative    Review of systems: The patient specifically denies any chest pain at rest or with exertion, dyspnea at rest or with exertion, orthopnea, paroxysmal nocturnal dyspnea, syncope, palpitations, focal neurological deficits, intermittent claudication, lower extremity edema, unexplained weight gain, cough, hemoptysis or wheezing.  The patient also denies abdominal pain, nausea, vomiting, dysphagia, diarrhea, constipation, polyuria, polydipsia, dysuria, hematuria, frequency, urgency, abnormal bleeding or bruising, fever, chills, unexpected weight changes, mood swings, change in skin or hair texture, change in voice quality, auditory or visual problems, allergic reactions or rashes, new musculoskeletal complaints other than usual "aches and pains".   PHYSICAL EXAM BP 132/102 mmHg  Pulse 80  Ht 6' (1.829 m)  Wt 287 lb 11.2 oz (130.5 kg)  BMI 39.01 kg/m2  General: Alert, oriented x3, no distress Head: no evidence  of trauma, PERRL, EOMI, no exophtalmos or lid lag, no myxedema, no xanthelasma; normal ears, nose and oropharynx Neck: normal jugular venous pulsations and no hepatojugular reflux; brisk carotid pulses without delay and no carotid bruits Chest: clear to auscultation, no signs of consolidation by percussion or palpation, normal fremitus, symmetrical and full respiratory excursions Cardiovascular: normal position and quality of the apical impulse, regular rhythm, normal first and second heart sounds, no murmurs, rubs or gallops Abdomen: no tenderness or  distention, no masses by palpation, no abnormal pulsatility or arterial bruits, normal bowel sounds, no hepatosplenomegaly Extremities: no clubbing, cyanosis or edema; 2+ radial, ulnar and brachial pulses bilaterally; 2+ right femoral, posterior tibial and dorsalis pedis pulses; 2+ left femoral, posterior tibial and dorsalis pedis pulses; no subclavian or femoral bruits Neurological: grossly nonfocal     BMET    Component Value Date/Time   NA 136 07/28/2014 1600   K 4.1 07/28/2014 1600   CL 103 07/28/2014 1600   CO2 28 07/28/2014 1600   GLUCOSE 83 07/28/2014 1600   BUN 14 07/28/2014 1600   CREATININE 0.9 07/28/2014 1600   CALCIUM 9.3 07/28/2014 1600     ASSESSMENT AND PLAN  George Brock had a "false positive" nuclear stress test related to exercise-induced rate related left bundle branch block with a fairly typical septal artifact. He has angiographically normal coronary arteries. I congratulated him on his efforts to achieve complete smoking cessation. We discussed the fact that the new nicotine vape products have been on certain safety records and that there is no true scientific data on using them for smoking cessation. However I think it makes sense to continue trying to wean off cigarettes altogether and then discontinue all nicotine products.  Regular exercise and smoking cessation is strongly recommended. He will follow-up as needed.  Junious SilkROITORU,Jakim Drapeau  Paulett Kaufhold, MD, [facility] [phone] office [phone] pager

## 2014-08-17 NOTE — Patient Instructions (Signed)
Dr. Croitoru recommends that you schedule a follow-up appointment in: AS NEEDED   

## 2014-08-27 ENCOUNTER — Encounter (HOSPITAL_COMMUNITY): Payer: Self-pay | Admitting: Cardiovascular Disease

## 2015-06-01 ENCOUNTER — Encounter: Payer: Self-pay | Admitting: Family Medicine

## 2015-06-01 DIAGNOSIS — K2100 Gastro-esophageal reflux disease with esophagitis, without bleeding: Secondary | ICD-10-CM

## 2015-06-01 DIAGNOSIS — R011 Cardiac murmur, unspecified: Secondary | ICD-10-CM | POA: Insufficient documentation

## 2015-06-01 DIAGNOSIS — N529 Male erectile dysfunction, unspecified: Secondary | ICD-10-CM

## 2015-06-01 DIAGNOSIS — J4520 Mild intermittent asthma, uncomplicated: Secondary | ICD-10-CM

## 2015-06-01 DIAGNOSIS — K219 Gastro-esophageal reflux disease without esophagitis: Secondary | ICD-10-CM | POA: Insufficient documentation

## 2015-06-01 DIAGNOSIS — F419 Anxiety disorder, unspecified: Secondary | ICD-10-CM | POA: Insufficient documentation

## 2015-06-01 DIAGNOSIS — T7840XA Allergy, unspecified, initial encounter: Secondary | ICD-10-CM | POA: Insufficient documentation

## 2015-06-07 ENCOUNTER — Ambulatory Visit (INDEPENDENT_AMBULATORY_CARE_PROVIDER_SITE_OTHER): Payer: Managed Care, Other (non HMO) | Admitting: Physician Assistant

## 2015-06-07 ENCOUNTER — Encounter: Payer: Self-pay | Admitting: Physician Assistant

## 2015-06-07 VITALS — BP 144/80 | HR 64 | Temp 98.6°F | Resp 18 | Ht 70.75 in | Wt 290.0 lb

## 2015-06-07 DIAGNOSIS — F419 Anxiety disorder, unspecified: Secondary | ICD-10-CM | POA: Diagnosis not present

## 2015-06-07 DIAGNOSIS — Z720 Tobacco use: Secondary | ICD-10-CM | POA: Diagnosis not present

## 2015-06-07 DIAGNOSIS — K2100 Gastro-esophageal reflux disease with esophagitis, without bleeding: Secondary | ICD-10-CM

## 2015-06-07 DIAGNOSIS — J309 Allergic rhinitis, unspecified: Secondary | ICD-10-CM | POA: Insufficient documentation

## 2015-06-07 DIAGNOSIS — J302 Other seasonal allergic rhinitis: Secondary | ICD-10-CM

## 2015-06-07 DIAGNOSIS — N529 Male erectile dysfunction, unspecified: Secondary | ICD-10-CM

## 2015-06-07 DIAGNOSIS — B009 Herpesviral infection, unspecified: Secondary | ICD-10-CM | POA: Diagnosis not present

## 2015-06-07 DIAGNOSIS — N528 Other male erectile dysfunction: Secondary | ICD-10-CM

## 2015-06-07 DIAGNOSIS — J4520 Mild intermittent asthma, uncomplicated: Secondary | ICD-10-CM | POA: Diagnosis not present

## 2015-06-07 DIAGNOSIS — E669 Obesity, unspecified: Secondary | ICD-10-CM

## 2015-06-07 NOTE — Progress Notes (Addendum)
Patient ID: George Brock MRN: [id number], DOB: 1965/12/15, 49 y.o. Date of Encounter: @  Chief Complaint:  Chief Complaint  Patient presents with  . new pt est care    not due CPE    HPI: 49 y.o. year old white male  presents as a new patient to establish care.  I REVIEWED ALL MEDICAL RECORDS FROM [facility]-----ALL INFORMATION DOCUMENTED IN TODAY'S NOTE:  Patient was going to [facility] but because that clinic is closing, he is here to establish care.  Says that he sees no other medical providers other than Cardiology and going to [facility]. Says that he had gone to [facility] for 16 years.  He had a false positive Cardiolite stress test. Underwent cardiac catheterization with Dr. Daphene Jaeger 08/03/2014 which was normal.  Says that Dr. Tresa Endo did tell him to start a baby aspirin daily. However says " I don't know why---- my blood pressure is good, my cholesterol is good. I know I'm overweight and I do smoke but..-- " Says he  "does not eat right" and he does not exercise except for the activity at work.  Says that he smokes one half pack per day and does not want to quit. Says that in the past Dr. Yehuda Budd discussed Chantix but once he read the side effects there was no way he was going to take that. Says that he is aware that smoking is bad for his health but he does not want to quit. Says that he believes in God and knows that he has to die from something.  Says that he takes the Protonix every day. His controls his GERD.  Says he usually has to go in to the PCP office in the spring and the fall. Says usually prescribe Z-Pak albuterol and Zyrtec-D and this works well.  Says that he had a colonoscopy at age 66 and that was normal.  No complaints or concerns to address today. Says that he is just here for "a meet and greet".  Says that the Xanax was prescribed when he was with a prior job. Says that since he has had a job change it is not as stressful and now he  only uses Xanax 1 or 2 per month.  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 Says that apparently his  "last girlfriend gave him that given the keeps giving."  I was reviewing his notes from [facility] note dated 03/15/15. 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 According to that office note patient reported he had never had an HSV-2 outbreak. They explained what symptoms he should monitor for and when to start acyclovir. Patient education was given and steps were given regarding preventing exposure to partner.  Also their office note dated 05/03/15--- that note was for genital warts. That note stated that he presented to clinic for cryotherapy to lesion on penis and testicles. Patient reported that he had had them for quite a while and was noticing that he seemed to be getting more lesions. He had tried over-the-counter topicals with no effect. At that visit they did apply cryotherapy. Was informed that if wart not completely resolved in 2-3 weeks to return for another prior treatment.  Last set of labs that I see was from 05/11/14. At that time he had the following: CBC normal CMET all normal Lipid profile with total cholesterol 122, triglycerides 57, HDL 43, LDL 73 Hemoglobin W0J 5.1  PSA 0.48 Microalbumin negative  Labs 03/09/15 showed: RPR nonreactive Hepatitis panel negative Urine gonorrhea chlamydia negative HSV antibodies were positive for type I and also positive for type II HIV was nonreactive/negative  Colonoscopy was performed at [facility] by Dr. Charlott Rakes on 07/20/2014. Showed diverticulosis in the sigmoid colon. Internal hemorrhoids. Repeat colonoscopy in 10 years for screening purposes.   Past Medical History  Diagnosis Date  . Erectile dysfunction   . GERD (gastroesophageal reflux disease)   . Asthma   . Tobacco abuse   . Allergy     seasonal  . Anxiety     work related  . Heart murmur    HSV  2  Home Meds: Outpatient Prescriptions Prior to Visit  Medication Sig Dispense Refill  . acyclovir (ZOVIRAX) 400 MG tablet Take 400 mg by mouth as needed.    Marland Kitchen albuterol (PROVENTIL HFA;VENTOLIN HFA) 108 (90 BASE) MCG/ACT inhaler Inhale 1-2 puffs into the lungs as needed for wheezing or shortness of breath.    . ALPRAZolam (XANAX XR) 0.5 MG 24 hr tablet Take 0.5 mg by mouth as needed for anxiety.    Marland Kitchen aspirin EC 81 MG tablet Take 1 tablet (81 mg total) by mouth daily.    . Cetirizine HCl (ZYRTEC ALLERGY PO) Take 10 mg by mouth daily.     . pantoprazole (PROTONIX) 40 MG tablet Take 40 mg by mouth daily.    . sildenafil (VIAGRA) 25 MG tablet Take 25 mg by mouth as needed for erectile dysfunction.     No facility-administered medications prior to visit.    Allergies:  Allergies  Allergen Reactions  . Codeine Other (See Comments)    Stomach upset    Social History   Social History  . Marital Status: Divorced    Spouse Name: N/A  . Number of Children: N/A  . Years of Education: N/A   Occupational History  . Not on file.   Social History Main Topics  . Smoking status: Current Some Day Smoker -- 0.50 packs/day    Types: Cigarettes  . Smokeless tobacco: Not on file  . Alcohol Use: 1.8 - 2.4 oz/week    1-2 Standard drinks or equivalent, 2 Glasses of wine per week  . Drug Use: No  . Sexual Activity: Yes    Birth Control/ Protection: None   Other Topics Concern  . Not on file   Social History Narrative    Family History  Problem Relation Age of Onset  . CAD Mother   . Diabetes Mother   . Heart disease Mother   . Cancer Father   . Hypertension Father      Review of Systems:  See HPI for pertinent ROS. All other ROS negative.    Physical Exam: Blood pressure 144/80, pulse 64, temperature 98.6 F (37 C), temperature source Oral, resp. rate 18, height 5' 10.75" (1.797 m), weight 290 lb (131.543 kg)., Body mass index is 40.74 kg/(m^2). General: Obese white male .  Appears in no acute distress. Neck: Supple. No thyromegaly. No lymphadenopathy. Lungs: Clear bilaterally to auscultation without wheezes, rales, or rhonchi. Breathing is unlabored. Heart: RRR with S1 S2. No murmurs, rubs, or gallops. Abdomen: Soft, non-tender, non-distended with normoactive bowel sounds. No hepatomegaly. No rebound/guarding. No obvious abdominal masses. Musculoskeletal:  Strength and tone normal for age. Extremities/Skin: Warm and dry.  No edema. No rashes or suspicious lesions. Neuro: Alert and oriented X 3. Moves all extremities spontaneously. Gait is normal. CNII-XII grossly in tact.  Psych:  Responds to questions appropriately with a normal affect.     ASSESSMENT AND PLAN:  49 y.o. year old male with  1. Tobacco abuse He is not interested in quitting.  2. Anxiety This is controlled since he has changed his job he only uses Xanax 1 or 2 times a month.  3. Impotence of organic origin He uses Viagra as needed for this.  4. Gastroesophageal reflux disease with esophagitis He takes Protonix daily for this. Symptoms are controlled with the Protonix.  5. Other seasonal allergic rhinitis He uses Zyrtec for this. Says that he usually has a flare in spring and fall.  6. Obesity (BMI 35.0-39.9 without comorbidity) He is aware that he has a poor diet and is aware of proper diet. He is aware that he does not do appropriate exercise but is not interested in doing this.  7. Mild intermittent asthma in adult without complication He states that in the spring and fall when his allergies flare, his asthma flares and he has to use albuterol at that time.  He can wait to schedule a complete physical exam once it has been a full year and next physical is due. As well follow-up if needed.   Signed, [address] [place], [place], [facility] 06/07/2015 11:52 AM

## 2015-09-06 ENCOUNTER — Other Ambulatory Visit: Payer: Self-pay | Admitting: Family Medicine

## 2015-09-06 ENCOUNTER — Ambulatory Visit (INDEPENDENT_AMBULATORY_CARE_PROVIDER_SITE_OTHER): Payer: Managed Care, Other (non HMO) | Admitting: Physician Assistant

## 2015-09-06 ENCOUNTER — Encounter: Payer: Self-pay | Admitting: Physician Assistant

## 2015-09-06 VITALS — BP 132/92 | HR 76 | Temp 98.3°F | Resp 18 | Ht 71.0 in | Wt 297.0 lb

## 2015-09-06 DIAGNOSIS — Z0000 Encounter for general adult medical examination without abnormal findings: Secondary | ICD-10-CM

## 2015-09-06 DIAGNOSIS — J988 Other specified respiratory disorders: Secondary | ICD-10-CM

## 2015-09-06 DIAGNOSIS — B9689 Other specified bacterial agents as the cause of diseases classified elsewhere: Secondary | ICD-10-CM

## 2015-09-06 LAB — CBC WITH DIFFERENTIAL/PLATELET
BASOS PCT: 0 % (ref 0–1)
Basophils Absolute: 0 10*3/uL (ref 0.0–0.1)
Eosinophils Absolute: 0.3 10*3/uL (ref 0.0–0.7)
Eosinophils Relative: 3 % (ref 0–5)
HEMATOCRIT: 46.3 % (ref 39.0–52.0)
HEMOGLOBIN: 16.3 g/dL (ref 13.0–17.0)
LYMPHS PCT: 20 % (ref 12–46)
Lymphs Abs: 1.7 10*3/uL (ref 0.7–4.0)
MCH: 32.5 pg (ref 26.0–34.0)
MCHC: 35.2 g/dL (ref 30.0–36.0)
MCV: 92.4 fL (ref 78.0–100.0)
MONOS PCT: 7 % (ref 3–12)
MPV: 9.2 fL (ref 8.6–12.4)
Monocytes Absolute: 0.6 10*3/uL (ref 0.1–1.0)
Neutro Abs: 6 10*3/uL (ref 1.7–7.7)
Neutrophils Relative %: 70 % (ref 43–77)
Platelets: 227 10*3/uL (ref 150–400)
RBC: 5.01 MIL/uL (ref 4.22–5.81)
RDW: 12.9 % (ref 11.5–15.5)
WBC: 8.5 10*3/uL (ref 4.0–10.5)

## 2015-09-06 LAB — COMPLETE METABOLIC PANEL WITH GFR
ALT: 20 U/L (ref 9–46)
AST: 17 U/L (ref 10–35)
Albumin: 4 g/dL (ref 3.6–5.1)
Alkaline Phosphatase: 62 U/L (ref 40–115)
BUN: 14 mg/dL (ref 7–25)
CALCIUM: 8.8 mg/dL (ref 8.6–10.3)
CO2: 25 mmol/L (ref 20–31)
Chloride: 103 mmol/L (ref 98–110)
Creat: 0.84 mg/dL (ref 0.70–1.33)
GFR, Est African American: >89 mL/min (ref >=60)
GFR, Est Non African American: >89 mL/min (ref >=60)
GLUCOSE: 83 mg/dL (ref 70–99)
Potassium: 4.4 mmol/L (ref 3.5–5.3)
SODIUM: 137 mmol/L (ref 135–146)
TOTAL PROTEIN: 6.7 g/dL (ref 6.1–8.1)
Total Bilirubin: 0.6 mg/dL (ref 0.2–1.2)

## 2015-09-06 LAB — LIPID PANEL
Cholesterol: 118 mg/dL — ABNORMAL LOW (ref 125–200)
HDL: 36 mg/dL — ABNORMAL LOW (ref >=40)
LDL Cholesterol: 71 mg/dL (ref <130)
Total CHOL/HDL Ratio: 3.3 Ratio (ref <=5.0)
Triglycerides: 57 mg/dL (ref <150)
VLDL: 11 mg/dL (ref <30)

## 2015-09-06 LAB — TSH: TSH: 1.486 u[IU]/mL (ref 0.350–4.500)

## 2015-09-06 MED ORDER — AZITHROMYCIN 250 MG PO TABS: ORAL_TABLET | ORAL | Status: DC

## 2015-09-06 MED ORDER — PANTOPRAZOLE SODIUM 40 MG PO TBEC: 40.0000 mg | DELAYED_RELEASE_TABLET | Freq: Every day | ORAL | Status: DC

## 2015-09-06 NOTE — Progress Notes (Signed)
Patient ID: George Brock MRN: [id number], DOB: [birth year] 49 y.o. Date of Encounter: 09/06/2015, 9:11 AM    Chief Complaint: Physical (CPE)  HPI: 49 y.o. y/o male here for CPE.   He has had congestion for past 3 weeks. Says he has taken $140 worth of otc meds--expectorant to loosen phlegm. Those meds have helped but all mucus and phlegm still have not cleared.   No other complaints/concerns.   Review of Systems: Consitutional: No fever, chills, fatigue, night sweats, lymphadenopathy, or weight changes. Eyes: No visual changes, eye redness, or discharge. ENT/Mouth: Ears: No otalgia, tinnitus, hearing loss, discharge. Nose: No congestion, rhinorrhea, sinus pain, or epistaxis. Throat: No sore throat, post nasal drip, or teeth pain. Cardiovascular: No CP, palpitations, diaphoresis, DOE, edema, orthopnea, PND. Respiratory: No cough, hemoptysis, SOB, or wheezing. Gastrointestinal: No anorexia, dysphagia, reflux, pain, nausea, vomiting, hematemesis, diarrhea, constipation, BRBPR, or melena. Genitourinary: No dysuria, frequency, urgency, hematuria, incontinence, nocturia, decreased urinary stream, discharge, impotence, or testicular pain/masses. Musculoskeletal: No decreased ROM, myalgias, stiffness, joint swelling, or weakness. Skin: No rash, erythema, lesion changes, pain, warmth, jaundice, or pruritis. Neurological: No headache, dizziness, syncope, seizures, tremors, memory loss, coordination problems, or paresthesias. Psychological: No anxiety, depression, hallucinations, SI/HI. Endocrine: No fatigue, polydipsia, polyphagia, polyuria, or known diabetes. All other systems were reviewed and are otherwise negative.  Past Medical History  Diagnosis Date  . Erectile dysfunction   . GERD (gastroesophageal reflux disease)   . Asthma   . Tobacco abuse   . Allergy     seasonal  . Anxiety     work related  . Heart murmur      Past Surgical History  Procedure Laterality Date  .  US echocardiography  06/07/2004  . Cardiovascular stress test      cardiolite  . Left heart catheterization with coronary angiogram N/A 08/03/2014    Procedure: LEFT HEART CATHETERIZATION WITH CORONARY ANGIOGRAM;  Surgeon: Lennette Bihari, MD;  Location: [facility] CATH LAB;  Service: Cardiovascular;  Laterality: N/A;    Home Meds:  Outpatient Prescriptions Prior to Visit  Medication Sig Dispense Refill  . acyclovir (ZOVIRAX) 400 MG tablet Take 400 mg by mouth as needed.    Marland Kitchen albuterol (PROVENTIL HFA;VENTOLIN HFA) 108 (90 BASE) MCG/ACT inhaler Inhale 1-2 puffs into the lungs as needed for wheezing or shortness of breath.    . ALPRAZolam (XANAX XR) 0.5 MG 24 hr tablet Take 0.5 mg by mouth as needed for anxiety.    Marland Kitchen aspirin EC 81 MG tablet Take 1 tablet (81 mg total) by mouth daily.    . Cetirizine HCl (ZYRTEC ALLERGY PO) Take 10 mg by mouth daily.     . pantoprazole (PROTONIX) 40 MG tablet Take 40 mg by mouth daily.    . sildenafil (VIAGRA) 25 MG tablet Take 25 mg by mouth as needed for erectile dysfunction.     No facility-administered medications prior to visit.    Allergies:  Allergies  Allergen Reactions  . Codeine Other (See Comments)    Stomach upset    Social History   Social History  . Marital Status: Divorced    Spouse Name: N/A  . Number of Children: N/A  . Years of Education: N/A   Occupational History  . Not on file.   Social History Main Topics  . Smoking status: Current Some Day Smoker -- 0.50 packs/day    Types: Cigarettes  . Smokeless tobacco: Not on file  . Alcohol Use: 1.8 - 2.4 oz/week  1-2 Standard drinks or equivalent, 2 Glasses of wine per week  . Drug Use: No  . Sexual Activity: Yes    Birth Control/ Protection: None   Other Topics Concern  . Not on file   Social History Narrative    Family History  Problem Relation Age of Onset  . CAD Mother   . Diabetes Mother   . Heart disease Mother   . Cancer Father   . Hypertension Father      Physical Exam: Blood pressure 132/92, pulse 76, temperature 98.3 F (36.8 C), temperature source Oral, resp. rate 18, height 5\' 11"  (1.803 m), weight 297 lb (134.718 kg).  General: Overweight WM.  Appears in no acute distress. HEENT: Normocephalic, atraumatic. Conjunctiva pink, sclera non-icteric. Pupils 2 mm constricting to 1 mm, round, regular, and equally reactive to light and accomodation. EOMI. Internal auditory canal clear. TMs with good cone of light and without pathology. Nasal mucosa pink. Nares are without discharge. No sinus tenderness. Oral mucosa pink. Pharynx without exudate.   Neck: Supple. Trachea midline. No thyromegaly. Full ROM. No lymphadenopathy. Lungs: Clear to auscultation bilaterally without wheezes, rales, or rhonchi. Breathing is of normal effort and unlabored. Cardiovascular: RRR with S1 S2. No murmurs, rubs, or gallops. Distal pulses 2+ symmetrically. No carotid or abdominal bruits. Abdomen: Soft, non-tender, non-distended with normoactive bowel sounds. No hepatosplenomegaly or masses. No rebound/guarding. No CVA tenderness. No hernias. Rectal: Deferred. Musculoskeletal: Full range of motion and 5/5 strength throughout. Without swelling, atrophy, tenderness, crepitus, or warmth. Extremities without clubbing, cyanosis, or edema. Calves supple. Skin: Warm and moist without erythema, ecchymosis, wounds, or rash. Neuro: A+Ox3. CN II-XII grossly intact. Moves all extremities spontaneously. Full sensation throughout. Normal gait. DTR 2+ throughout upper and lower extremities. Finger to nose intact. Psych:  Responds to questions appropriately with a normal affect.   Assessment/Plan:  49 y.o. y/o  white male here for CPE  -1. Visit for preventive health examination  A. Screening Labs: He is fasting.  - CBC with Differential/Platelet - COMPLETE METABOLIC PANEL WITH GFR - Lipid panel - TSH - VITAMIN D 25 Hydroxy (Vit-D Deficiency, Fractures) - PSA  B. Screening  For Prostate Cancer: - PSA  C. Screening For Colorectal Cancer:  [facility] by Dr. Charlott RakesVincent Schooler on 07/20/2014. Showed diverticulosis in the sigmoid colon. Internal hemorrhoids. Repeat colonoscopy in 10 years for screening purposes.  D. Immunizations: Flu---------------------------------Discussed risks vs benefits, but he still defers.  Tetanus--------------------------He says he hasn't had any shots in very long time---recommended he get this, especially as he does frequently work outside and with metal etc--but he still defers Pneumococcal-----------------Given that he is a smoker, recommended this but her defers. Voices understanding but defers Zostavax--------------------------Not recommended until age 49  2. Bacterial respiratory infection He is to take abx as directed. F/U if symptoms do not resolve within one week after completion of abx - azithromycin (ZITHROMAX) 250 MG tablet; Day 1: Take 2 daily. Days 2-5: Take 1 daily.  Dispense: 6 tablet; Refill: 0  His other medical problems are addressed in his one prior OV note with me. See that note for that info.    Signed:   [address]Mary Beth BurkeDixon,[place], [place]  09/06/2015 9:11 AM

## 2015-09-07 LAB — PSA: PSA: 0.54 ng/mL (ref <=4.00)

## 2015-09-07 LAB — VITAMIN D 25 HYDROXY (VIT D DEFICIENCY, FRACTURES): VIT D 25 HYDROXY: 24 ng/mL — AB (ref 30–100)

## 2015-09-09 ENCOUNTER — Encounter: Payer: Self-pay | Admitting: Family Medicine

## 2015-11-17 ENCOUNTER — Ambulatory Visit (INDEPENDENT_AMBULATORY_CARE_PROVIDER_SITE_OTHER): Payer: Managed Care, Other (non HMO) | Admitting: Physician Assistant

## 2015-11-17 ENCOUNTER — Encounter: Payer: Self-pay | Admitting: Physician Assistant

## 2015-11-17 VITALS — BP 126/80 | HR 74 | Temp 98.1°F | Resp 18 | Wt 301.0 lb

## 2015-11-17 DIAGNOSIS — B9689 Other specified bacterial agents as the cause of diseases classified elsewhere: Principal | ICD-10-CM

## 2015-11-17 DIAGNOSIS — J988 Other specified respiratory disorders: Secondary | ICD-10-CM

## 2015-11-17 MED ORDER — DOXYCYCLINE HYCLATE 100 MG PO TABS: 100.0000 mg | ORAL_TABLET | Freq: Two times a day (BID) | ORAL | Status: DC

## 2015-11-17 MED ORDER — PANTOPRAZOLE SODIUM 40 MG PO TBEC: 40.0000 mg | DELAYED_RELEASE_TABLET | Freq: Every day | ORAL | Status: DC

## 2015-11-17 MED ORDER — ALPRAZOLAM ER 0.5 MG PO TB24: 0.5000 mg | ORAL_TABLET | ORAL | Status: DC | PRN

## 2015-11-17 NOTE — Progress Notes (Signed)
    Patient ID: George Brock MRN: [id number], DOB: Jun 24, 1966, 50 y.o. Date of Encounter: 11/17/2015, 6:48 PM    Chief Complaint:  Chief Complaint  Patient presents with  . Sinus infection     HPI: 50 y.o. year old white male says he is having mucus from his nose that was yellow and now getting more green. Says everytime this happens, it goes down his throat and into his chest. No significant sore throat. No fever/chills.   Also requesting refill on Xanax. Say son average uses about 3 -4 per week.      Home Meds:   Outpatient Prescriptions Prior to Visit  Medication Sig Dispense Refill  . acyclovir (ZOVIRAX) 400 MG tablet Take 400 mg by mouth as needed.    Marland Kitchen albuterol (PROVENTIL HFA;VENTOLIN HFA) 108 (90 BASE) MCG/ACT inhaler Inhale 1-2 puffs into the lungs as needed for wheezing or shortness of breath.    Marland Kitchen aspirin EC 81 MG tablet Take 1 tablet (81 mg total) by mouth daily.    . Cetirizine HCl (ZYRTEC ALLERGY PO) Take 10 mg by mouth daily.     . sildenafil (VIAGRA) 25 MG tablet Take 25 mg by mouth as needed for erectile dysfunction.    Marland Kitchen ALPRAZolam (XANAX XR) 0.5 MG 24 hr tablet Take 0.5 mg by mouth as needed for anxiety.    . pantoprazole (PROTONIX) 40 MG tablet Take 1 tablet (40 mg total) by mouth daily. 90 tablet 1  . azithromycin (ZITHROMAX) 250 MG tablet Day 1: Take 2 daily. Days 2-5: Take 1 daily. 6 tablet 0   No facility-administered medications prior to visit.    Allergies:  Allergies  Allergen Reactions  . Codeine Other (See Comments)    Stomach upset      Review of Systems: See HPI for pertinent ROS. All other ROS negative.    Physical Exam: Blood pressure 126/80, pulse 74, temperature 98.1 F (36.7 C), temperature source Oral, resp. rate 18, weight 301 lb (136.533 kg)., Body mass index is 42 kg/(m^2). General:  WM. Appears in no acute distress. HEENT: Normocephalic, atraumatic, eyes without discharge, sclera non-icteric, nares are without discharge.  Bilateral auditory canals clear, TM's are without perforation, pearly grey and translucent with reflective cone of light bilaterally. Oral cavity moist, posterior pharynx without exudate, erythema, peritonsillar abscess. No tenderness with percussion to frontal or maxillary sinuses bilaterally.   Neck: Supple. No thyromegaly. No lymphadenopathy. Lungs: Clear bilaterally to auscultation without wheezes, rales, or rhonchi. Breathing is unlabored. Heart: Regular rhythm. No murmurs, rubs, or gallops. Msk:  Strength and tone normal for age. Extremities/Skin: Warm and dry. Neuro: Alert and oriented X 3. Moves all extremities spontaneously. Gait is normal. CNII-XII grossly in tact. Psych:  Responds to questions appropriately with a normal affect.     ASSESSMENT AND PLAN:  50 y.o. year old male with  1. Bacterial respiratory infection Pt reports that ZPack prescribed for last inxn did not work very well. Will use Doxy. F/u if symptoms do not resolve after completion of abx.  - doxycycline (VIBRA-TABS) 100 MG tablet; Take 1 tablet (100 mg total) by mouth 2 (two) times daily.  Dispense: 14 tablet; Refill: 0  Refilled Xanax for # 30 + 0.  Murray Hodgkins [place], [place], [facility] 11/17/2015 6:48 PM

## 2016-03-14 ENCOUNTER — Other Ambulatory Visit: Payer: Self-pay | Admitting: Family Medicine

## 2016-03-14 MED ORDER — ALBUTEROL SULFATE HFA 108 (90 BASE) MCG/ACT IN AERS: 1.0000 | INHALATION_SPRAY | RESPIRATORY_TRACT | Status: DC | PRN

## 2016-03-14 MED ORDER — SILDENAFIL CITRATE 25 MG PO TABS: 25.0000 mg | ORAL_TABLET | ORAL | Status: DC | PRN

## 2016-03-14 NOTE — Telephone Encounter (Signed)
30

## 2016-03-14 NOTE — Telephone Encounter (Signed)
Wants 90 day supply Viagra to mail order.  We have never refilled.  What quantity should I put if OK with you???

## 2016-03-14 NOTE — Telephone Encounter (Signed)
Medication refilled per protocol. 

## 2016-03-16 ENCOUNTER — Encounter: Payer: Self-pay | Admitting: Physician Assistant

## 2016-03-16 ENCOUNTER — Ambulatory Visit (INDEPENDENT_AMBULATORY_CARE_PROVIDER_SITE_OTHER): Payer: Managed Care, Other (non HMO) | Admitting: Physician Assistant

## 2016-03-16 VITALS — BP 138/94 | HR 85 | Ht 71.0 in | Wt 302.2 lb

## 2016-03-16 DIAGNOSIS — R5383 Other fatigue: Secondary | ICD-10-CM

## 2016-03-16 DIAGNOSIS — R079 Chest pain, unspecified: Secondary | ICD-10-CM | POA: Diagnosis not present

## 2016-03-16 DIAGNOSIS — R002 Palpitations: Secondary | ICD-10-CM

## 2016-03-16 NOTE — Patient Instructions (Signed)
Your physician recommends that you schedule a follow-up appointment in: WITH DR CROITORU NEXT AVAILABLE

## 2016-03-16 NOTE — Progress Notes (Signed)
Cardiology Office Note   Date:  03/17/2016   ID:  George Brock, DOB 01/17/1966, MRN [id number]  PCP:  Frazier RichardsIXON,MARY BETH, PA-C  Cardiologist:  Dr Chrisandra Nettersroitoru  Barrett, Rhonda, PA-C   Chief Complaint  Patient presents with  . Palpitations    skipping beats  . Fatigue  . Chest Pain  . Back Pain    History of Present Illness: George Brock is a 50 y.o. male with a history of tob use, FH CAD, palpitations, abnl MV 2015>>cath w/ nl cors & EF 55-60%, ED  George Brock presents for Evaluation of palpitations, fatigue, chest and back pain.  [name] gets palpitations on a regular basis. He had sinusitis in March and April. He will feel his heart skip and when he feels his pulse he is able to detect this. It does not skip more than 1 m a time. By his description, he is probably having 8-10 skipped beats (probably PVCs) a minute. They never made him lightheaded or dizzy.  He has been having fatigue for a long time. It is not changed recently. He does not remember getting a TSH checked. He sleeps from 8 hours but does not feel rested. He has nocturia one or 2 times a night, but can get back to sleep afterwards. He has never been tested for sleep apnea.  Sometimes he has to work in the heat. 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 He had left-sided chest pain. He states the pain was inside his chest and his chest wall was nontender. The pain resolves with rest. He gets it with exertion most days, but not every day.  He does not wish to have another Myoview, saying that the last Myoview lead to a heart catheterization which was clean.  He is frustrated because he keeps gaining weight. However, he admits that because he works 12 or 13 hours today, he does not take the time to cook properly. He describes some of the meals he has cooked recently and they include things like a 12 ounce steak with a loaded baked potato and a salad with plenty of dressing.   Past  Medical History  Diagnosis Date  . Erectile dysfunction   . GERD (gastroesophageal reflux disease)   . Asthma   . Tobacco abuse   . Allergy     seasonal  . Anxiety     work related  . Heart murmur     Past Surgical History  Procedure Laterality Date  . [place] echocardiography  06/07/2004  . Cardiovascular stress test      cardiolite  . Left heart catheterization with coronary angiogram N/A 08/03/2014    Procedure: LEFT HEART CATHETERIZATION WITH CORONARY ANGIOGRAM;  Surgeon: Lennette Biharihomas A Kelly, MD;  [facility];  Service: Cardiovascular;  Laterality: N/A;    Current Outpatient Prescriptions  Medication Sig Dispense Refill  . acyclovir (ZOVIRAX) 400 MG tablet Take 400 mg by mouth as needed.    Marland Kitchen. albuterol (PROVENTIL HFA;VENTOLIN HFA) 108 (90 Base) MCG/ACT inhaler Inhale 1-2 puffs into the lungs as needed for wheezing or shortness of breath. 18 g 3  . ALPRAZolam (XANAX XR) 0.5 MG 24 hr tablet Take 1 tablet (0.5 mg total) by mouth as needed for anxiety. 30 tablet 0  . aspirin EC 81 MG tablet Take 1 tablet (81 mg total) by mouth daily.    . Cetirizine HCl (ZYRTEC ALLERGY PO) Take 10 mg by mouth daily.     .Marland Kitchen  doxycycline (VIBRA-TABS) 100 MG tablet Take 1 tablet (100 mg total) by mouth 2 (two) times daily. 14 tablet 0  . sildenafil (VIAGRA) 25 MG tablet Take 1 tablet (25 mg total) by mouth as needed for erectile dysfunction. 30 tablet 0   No current facility-administered medications for this visit.    Allergies:   Codeine    Social History:  The patient  reports that he has been smoking Cigarettes.  He has been smoking about 0.50 packs per day. He does not have any smokeless tobacco history on file. He reports that he drinks about 1.8 - 2.4 oz of alcohol per week. He reports that he does not use illicit drugs.   Family History:  The patient's family history includes CAD in his mother; Cancer in his father; Diabetes in his mother; Heart disease in his mother; Hypertension in his  father.    ROS:  Please see the history of present illness. All other systems are reviewed and negative.    PHYSICAL EXAM: VS:  BP 138/94 mmHg  Pulse 85  Ht 5\' 11"  (1.803 m)  Wt 302 lb 3.2 oz (137.077 kg)  BMI 42.17 kg/m2 , BMI Body mass index is 42.17 kg/(m^2). GEN: Well nourished, well developed, male in no acute distress HEENT: normal for age  Neck: no JVD, no carotid bruit, no masses Cardiac: RRR; no murmur, no rubs, or gallops Respiratory:  clear to auscultation bilaterally, normal work of breathing GI: soft, nontender, nondistended, + BS MS: no deformity or atrophy; no edema; distal pulses are 2+ in all 4 extremities  Skin: warm and dry, no rash Neuro:  Strength and sensation are intact Psych: euthymic mood, full affect   EKG:  EKG is ordered today. The ekg ordered today demonstrates sinus rhythm, normal intervals, no change from 2015   Recent Labs: 09/06/2015: ALT 20; Hemoglobin 16.3; Platelets 227 03/16/2016: BUN 15; Creat 1.05; Potassium 4.5; Sodium 138; TSH 1.69    Lipid Panel    Component Value Date/Time   CHOL 118* 09/06/2015 0907   TRIG 57 09/06/2015 0907   HDL 36* 09/06/2015 0907   CHOLHDL 3.3 09/06/2015 0907   VLDL 11 09/06/2015 0907   LDLCALC 71 09/06/2015 0907     Wt Readings from Last 3 Encounters:  03/16/16 302 lb 3.2 oz (137.077 kg)  11/17/15 301 lb (136.533 kg)  09/06/15 297 lb (134.718 kg)     Other studies Reviewed: Additional studies/ records that were reviewed today include: Office notes, hospital records and testing.  ASSESSMENT AND PLAN:  1.  Fatigue: He may have some electrolyte abnormalities from inadequate fluid intake and working outside in the heat. He also does not ever remember having a TSH checked. We will check a BMET and a TSH. If those are both normal, he is encouraged to stay hydrated and follow-up with primary care.  2. Palpitations: I felt his heart skip a beat occasionally when I checked his pulse, and there was a  compensatory pause. This is most likely PVCs. If his potassium is low, we will check a magnesium.  3. Chest pain: He gets chest pain with exertion most days, but the exertion level is not consistent. He doesn't really want a heart catheterization but also does not want a Myoview. I explained a stress echocardiogram but he does not wish that either. He does not have a dye allergy, we could consider cardiac CT.  4. Obesity: I discussed heart healthy eating changes, and encouraged him to try to find things  that he liked to eat which are healthy.  5. Borderline hypertension: According to the patient, his blood pressure does not normally run high, but he also admits he does not check it regularly. We will follow-up on this, his blood pressure remained elevated, consider amlodipine.  He requests that Dr. Royann Shivers review the data and decide on the appropriate test.   Current medicines are reviewed at length with the patient today.  The patient does not have concerns regarding medicines.  The following changes have been made:  no change  Labs/ tests ordered today include:   Orders Placed This Encounter  Procedures  . Basic Metabolic Panel (BMET)  . TSH  . EKG 12-Lead     Disposition:   FU with Dr. Royann Shivers  Signed, Leanna Battles  03/17/2016 7:37 AM    [facility] Phone: [phone]; Fax: [phone]  This note was written with the assistance of speech recognition software. Please excuse any transcriptional errors.

## 2016-03-17 ENCOUNTER — Encounter: Payer: Self-pay | Admitting: Physician Assistant

## 2016-03-17 LAB — BASIC METABOLIC PANEL
BUN: 15 mg/dL (ref 7–25)
CHLORIDE: 104 mmol/L (ref 98–110)
CO2: 23 mmol/L (ref 20–31)
Calcium: 9.4 mg/dL (ref 8.6–10.3)
Creat: 1.05 mg/dL (ref 0.70–1.33)
Glucose, Bld: 86 mg/dL (ref 65–99)
Potassium: 4.5 mmol/L (ref 3.5–5.3)
SODIUM: 138 mmol/L (ref 135–146)

## 2016-03-17 LAB — TSH: TSH: 1.69 m[IU]/L (ref 0.40–4.50)

## 2016-03-20 ENCOUNTER — Other Ambulatory Visit: Payer: Self-pay | Admitting: Family Medicine

## 2016-03-20 MED ORDER — ALBUTEROL SULFATE HFA 108 (90 BASE) MCG/ACT IN AERS: 2.0000 | INHALATION_SPRAY | Freq: Four times a day (QID) | RESPIRATORY_TRACT | Status: DC | PRN

## 2016-03-22 ENCOUNTER — Telehealth: Payer: Self-pay | Admitting: Cardiovascular Disease

## 2016-03-22 NOTE — Telephone Encounter (Signed)
Calling to get lab results .Marland Kitchen. Thanks

## 2016-03-22 NOTE — Telephone Encounter (Signed)
Labs results not yet reviewed by Theodore Demarkhonda Barrett, PA-C. Patient notified that labs values were within normal range, however, not able to disclose any other information at this time. Patient has appointment with Dr Royann Shiversroitoru on 7/717. Notified patient that results could be reviewed with patient in further detail at that office visit. Patient verbalized understanding and agreed with plan.

## 2016-03-24 ENCOUNTER — Ambulatory Visit (INDEPENDENT_AMBULATORY_CARE_PROVIDER_SITE_OTHER): Payer: Managed Care, Other (non HMO) | Admitting: Cardiovascular Disease

## 2016-03-24 ENCOUNTER — Encounter: Payer: Self-pay | Admitting: Cardiovascular Disease

## 2016-03-24 VITALS — BP 135/87 | HR 73 | Ht 72.0 in | Wt 302.0 lb

## 2016-03-24 DIAGNOSIS — I493 Ventricular premature depolarization: Secondary | ICD-10-CM | POA: Diagnosis not present

## 2016-03-24 DIAGNOSIS — Z720 Tobacco use: Secondary | ICD-10-CM

## 2016-03-24 DIAGNOSIS — R5382 Chronic fatigue, unspecified: Secondary | ICD-10-CM | POA: Diagnosis not present

## 2016-03-24 DIAGNOSIS — I2510 Atherosclerotic heart disease of native coronary artery without angina pectoris: Secondary | ICD-10-CM | POA: Diagnosis not present

## 2016-03-24 NOTE — Patient Instructions (Signed)
Dr Royann Shiversroitoru recommends that you continue on your current medications as directed. Please refer to the Current Medication list given to you today.  Your physician encouraged you to lose weight for better health.  Dr Royann Shiversroitoru recommends that you schedule a follow-up appointment in 6 months. You will receive a reminder letter in the mail two months in advance. If you don't receive a letter, please call our office to schedule the follow-up appointment.  If you need a refill on your cardiac medications before your next appointment, please call your pharmacy.

## 2016-03-24 NOTE — Progress Notes (Signed)
Cardiology Office Note    Date:  03/26/2016   ID:  George Brock, DOB [birth year], MRN [id number]  PCP:  Frazier Richards, PA-C  Cardiologist:   Thurmon Fair, MD   Chief Complaint  Patient presents with  . Follow-up    History of Present Illness:  George Brock is a 50 y.o. male with moderate coronary artery stenosis without significant stenosis by cardiac catheterization performed in 2015 (false positive nuclear perfusion study), tobacco abuse, morbid obesity. Preserved left ventricular systolic function.  He has noticed varying frequencies of irregular rhythm. He notices a "skipped beat" every 10 or 20 normal beats. He does not actually feel palpitations except if he is checking his pulse. He denies dizziness, syncope anginal chest pain or exertional dyspnea. 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 His diet remains rather unhealthy. He is morbidly obese now and has steadily gained weight.    Past Medical History  Diagnosis Date  . Erectile dysfunction   . GERD (gastroesophageal reflux disease)   . Asthma   . Tobacco abuse   . Allergy     seasonal  . Anxiety     work related  . Heart murmur     Past Surgical History  Procedure Laterality Date  . US echocardiography  06/07/2004  . Cardiovascular stress test      cardiolite  . Left heart catheterization with coronary angiogram N/A 08/03/2014    Procedure: LEFT HEART CATHETERIZATION WITH CORONARY ANGIOGRAM;  Surgeon: Lennette Bihari, MD;  Location: [facility] CATH LAB;  Service: Cardiovascular;  Laterality: N/A;    Current Medications: Outpatient Prescriptions Prior to Visit  Medication Sig Dispense Refill  . acyclovir (ZOVIRAX) 400 MG tablet Take 400 mg by mouth as needed.    Marland Kitchen albuterol (PROVENTIL HFA;VENTOLIN HFA) 108 (90 Base) MCG/ACT inhaler Inhale 2 puffs into the lungs every 6 (six) hours as needed for  wheezing or shortness of breath. 18 g 3  . ALPRAZolam (XANAX XR) 0.5 MG 24 hr tablet Take 1 tablet (0.5 mg total) by mouth as needed for anxiety. 30 tablet 0  . aspirin EC 81 MG tablet Take 1 tablet (81 mg total) by mouth daily.    . Cetirizine HCl (ZYRTEC ALLERGY PO) Take 10 mg by mouth daily.     Marland Kitchen doxycycline (VIBRA-TABS) 100 MG tablet Take 1 tablet (100 mg total) by mouth 2 (two) times daily. 14 tablet 0  . sildenafil (VIAGRA) 25 MG tablet Take 1 tablet (25 mg total) by mouth as needed for erectile dysfunction. 30 tablet 0   No facility-administered medications prior to visit.     Allergies:   Codeine   Social History   Social History  . Marital Status: Divorced    Spouse Name: N/A  . Number of Children: N/A  . Years of Education: N/A   Occupational History  . [employer], Technical sales engineer    Social History Main Topics  . Smoking status: Current Some Day Smoker -- 0.50 packs/day    Types: Cigarettes  . Smokeless tobacco: None  . Alcohol Use: 1.8 - 2.4 oz/week    1-2 Standard drinks or equivalent, 2 Glasses of wine per week  . Drug Use: No  . Sexual Activity: Yes    Birth Control/ Protection: None   Other Topics Concern  . None   Social History Narrative     Family  History:  The patient's family history includes CAD in his mother; Cancer in his father; Diabetes in his mother; Heart disease in his mother; Hypertension in his father.   ROS:   Please see the history of present illness.    ROS All other systems reviewed and are negative.   PHYSICAL EXAM:   VS:  BP 135/87 mmHg  Pulse 73  Ht 6' (1.829 m)  Wt 136.986 kg (302 lb)  BMI 40.95 kg/m2   GEN: Morbidly obese, well developed, in no acute distress HEENT: normal Neck: no JVD, carotid bruits, or masses Cardiac: RRR; no murmurs, rubs, or gallops,no edema  Respiratory:  clear to auscultation bilaterally, normal work of breathing GI: soft, nontender, nondistended, + BS MS: no deformity or atrophy Skin:  warm and dry, no rash Neuro:  Alert and Oriented x 3, Strength and sensation are intact Psych: euthymic mood, full affect  Wt Readings from Last 3 Encounters:  03/24/16 136.986 kg (302 lb)  03/16/16 137.077 kg (302 lb 3.2 oz)  11/17/15 136.533 kg (301 lb)      Studies/Labs Reviewed:   EKG:  EKG is not ordered today.    Recent Labs: 09/06/2015: ALT 20; Hemoglobin 16.3; Platelets 227 03/16/2016: BUN 15; Creat 1.05; Potassium 4.5; Sodium 138; TSH 1.69   Lipid Panel    Component Value Date/Time   CHOL 118* 09/06/2015 0907   TRIG 57 09/06/2015 0907   HDL 36* 09/06/2015 0907   CHOLHDL 3.3 09/06/2015 0907   VLDL 11 09/06/2015 0907   LDLCALC 71 09/06/2015 0907      ASSESSMENT:    1. Chronic fatigue   2. Morbid obesity due to excess calories (HCC)   3. Asymptomatic PVCs   4. Atherosclerosis of native coronary artery of native heart without angina pectoris   5. Tobacco abuse      PLAN:  In order of problems listed above:  1. Fatigue might be explained by obstructive sleep apnea. Routine labs and thyroid tests are normal. Encouraged him to consider sleep study but he is reluctant due to the cost. His father has sleep apnea. He will let me know if he changes his mind. 2. He is at high risk for future complications due to morbid obesity. Strongly encouraged to lose weight whether or not he decides to go ahead with a sleep study. A couple of years ago he managed to lose down to 220 pounds following his divorce. 3. In the absence of significant structural heart disease his asymptomatic PVCs do not require treatment. Avoid excessive stimulants. 4. His recent complaints do not sound compatible with coronary insufficiency. He has known coronary atherosclerosis without significant obstruction by a relatively recent cardiac catheterization. Nuclear stress testing is again likely to yield false positive results. 5. Strongly encouraged to quit smoking    Medication Adjustments/Labs and  Tests Ordered: Current medicines are reviewed at length with the patient today.  Concerns regarding medicines are outlined above.  Medication changes, Labs and Tests ordered today are listed in the Patient Instructions below. Patient Instructions  Dr Royann Shiversroitoru recommends that you continue on your current medications as directed. Please refer to the Current Medication list given to you today.  Your physician encouraged you to lose weight for better health.  Dr Royann Shiversroitoru recommends that you schedule a follow-up appointment in 6 months. You will receive a reminder letter in the mail two months in advance. If you don't receive a letter, please call our office to schedule the follow-up appointment.  If you  need a refill on your cardiac medications before your next appointment, please call your pharmacy.     Signed, Thurmon FairMihai Ethie Curless, MD  03/26/2016 3:44 PM    [facility] [address], [place], [place]  [address] Phone: [phone]; Fax: [phone]

## 2016-03-26 DIAGNOSIS — R5383 Other fatigue: Secondary | ICD-10-CM | POA: Insufficient documentation

## 2016-03-26 DIAGNOSIS — I493 Ventricular premature depolarization: Secondary | ICD-10-CM | POA: Insufficient documentation

## 2016-03-26 DIAGNOSIS — I2510 Atherosclerotic heart disease of native coronary artery without angina pectoris: Secondary | ICD-10-CM | POA: Insufficient documentation

## 2016-03-28 ENCOUNTER — Telehealth: Payer: Self-pay | Admitting: Family Medicine

## 2016-03-28 NOTE — Telephone Encounter (Signed)
Pt wants refill of his Viagra.  He wants 100 mg tabs, so he can cut them and then last longer.  He was getting #24 100 mg tabs from previous PCP from mail order.  #24 for 90 days.  This lasts him a long time, again, as he cuts the pills.  His cost is the same no matter what the strength is.  If he gets 90 days w/ 3 refills he says it last all year and usually doesn't even get all the refills.  He please asks that we send refill that way.

## 2016-03-29 MED ORDER — SILDENAFIL CITRATE 100 MG PO TABS: 100.0000 mg | ORAL_TABLET | ORAL | Status: DC

## 2016-03-29 NOTE — Telephone Encounter (Signed)
Approved---for 100mg  tabs   # 24 + 3 refills.

## 2016-03-29 NOTE — Telephone Encounter (Signed)
Refill sent.

## 2016-11-20 ENCOUNTER — Encounter: Payer: Self-pay | Admitting: Physician Assistant

## 2016-11-20 ENCOUNTER — Other Ambulatory Visit: Payer: Self-pay

## 2016-11-20 ENCOUNTER — Telehealth: Payer: Self-pay

## 2016-11-20 ENCOUNTER — Ambulatory Visit (INDEPENDENT_AMBULATORY_CARE_PROVIDER_SITE_OTHER): Payer: Managed Care, Other (non HMO) | Admitting: Physician Assistant

## 2016-11-20 VITALS — BP 148/110 | HR 85 | Temp 97.7°F | Resp 18 | Wt 295.4 lb

## 2016-11-20 DIAGNOSIS — I10 Essential (primary) hypertension: Secondary | ICD-10-CM | POA: Insufficient documentation

## 2016-11-20 DIAGNOSIS — Z0000 Encounter for general adult medical examination without abnormal findings: Secondary | ICD-10-CM | POA: Diagnosis not present

## 2016-11-20 DIAGNOSIS — E669 Obesity, unspecified: Secondary | ICD-10-CM | POA: Diagnosis not present

## 2016-11-20 DIAGNOSIS — Z113 Encounter for screening for infections with a predominantly sexual mode of transmission: Secondary | ICD-10-CM

## 2016-11-20 DIAGNOSIS — I2510 Atherosclerotic heart disease of native coronary artery without angina pectoris: Secondary | ICD-10-CM

## 2016-11-20 DIAGNOSIS — Z720 Tobacco use: Secondary | ICD-10-CM | POA: Diagnosis not present

## 2016-11-20 LAB — CBC WITH DIFFERENTIAL/PLATELET
BASOS ABS: 0 {cells}/uL (ref 0–200)
Basophils Relative: 0 %
EOS PCT: 4 %
Eosinophils Absolute: 280 cells/uL (ref 15–500)
HCT: 48.2 % (ref 38.5–50.0)
Hemoglobin: 16.4 g/dL (ref 13.0–17.0)
Lymphocytes Relative: 26 %
Lymphs Abs: 1820 cells/uL (ref 850–3900)
MCH: 32 pg (ref 27.0–33.0)
MCHC: 34 g/dL (ref 32.0–36.0)
MCV: 94 fL (ref 80.0–100.0)
MONOS PCT: 6 %
MPV: 8.7 fL (ref 7.5–12.5)
Monocytes Absolute: 420 cells/uL (ref 200–950)
NEUTROS ABS: 4480 {cells}/uL (ref 1500–7800)
Neutrophils Relative %: 64 %
PLATELETS: 215 10*3/uL (ref 140–400)
RBC: 5.13 MIL/uL (ref 4.20–5.80)
RDW: 13.7 % (ref 11.0–15.0)
WBC: 7 10*3/uL (ref 3.8–10.8)

## 2016-11-20 LAB — LIPID PANEL
CHOL/HDL RATIO: 3.1 ratio (ref <5.0)
Cholesterol: 106 mg/dL (ref <200)
HDL: 34 mg/dL — ABNORMAL LOW (ref >40)
LDL Cholesterol: 60 mg/dL (ref <100)
TRIGLYCERIDES: 61 mg/dL (ref <150)
VLDL: 12 mg/dL (ref <30)

## 2016-11-20 LAB — COMPLETE METABOLIC PANEL WITH GFR
ALK PHOS: 69 U/L (ref 40–115)
ALT: 19 U/L (ref 9–46)
AST: 17 U/L (ref 10–35)
Albumin: 4.1 g/dL (ref 3.6–5.1)
BUN: 13 mg/dL (ref 7–25)
CO2: 25 mmol/L (ref 20–31)
Calcium: 9.3 mg/dL (ref 8.6–10.3)
Chloride: 105 mmol/L (ref 98–110)
Creat: 0.89 mg/dL (ref 0.70–1.33)
Glucose, Bld: 97 mg/dL (ref 70–99)
Potassium: 4.7 mmol/L (ref 3.5–5.3)
Sodium: 137 mmol/L (ref 135–146)
TOTAL PROTEIN: 6.3 g/dL (ref 6.1–8.1)
Total Bilirubin: 0.6 mg/dL (ref 0.2–1.2)

## 2016-11-20 LAB — PSA: PSA: 0.5 ng/mL (ref <=4.0)

## 2016-11-20 LAB — TSH: TSH: 1.48 mIU/L (ref 0.40–4.50)

## 2016-11-20 MED ORDER — ENALAPRIL MALEATE 10 MG PO TABS: 10.0000 mg | ORAL_TABLET | Freq: Every day | ORAL | 0 refills | Status: DC

## 2016-11-20 NOTE — Telephone Encounter (Signed)
rx refill sent to pharmacy 

## 2016-11-20 NOTE — Telephone Encounter (Signed)
Patient states his blood pressure medication was sent to the wrong pharmacy

## 2016-11-20 NOTE — Progress Notes (Signed)
Patient ID: George Brock MRN: [id number], DOB: 27-Sep-1965 51 y.o. Date of Encounter: 11/20/2016, 9:14 AM    Chief Complaint: Physical (CPE)  HPI: 51 y.o. y/o male here for CPE.   Requests to check an STD screening. No other complaints/concerns.   Review of Systems: Consitutional: No fever, chills, fatigue, night sweats, lymphadenopathy, or weight changes. Eyes: No visual changes, eye redness, or discharge. ENT/Mouth: Ears: No otalgia, tinnitus, hearing loss, discharge. Nose: No congestion, rhinorrhea, sinus pain, or epistaxis. Throat: No sore throat, post nasal drip, or teeth pain. Cardiovascular: No CP, palpitations, diaphoresis, DOE, edema, orthopnea, PND. Respiratory: No cough, hemoptysis, SOB, or wheezing. Gastrointestinal: No anorexia, dysphagia, reflux, pain, nausea, vomiting, hematemesis, diarrhea, constipation, BRBPR, or melena. Genitourinary: No dysuria, frequency, urgency, hematuria, incontinence, nocturia, decreased urinary stream, discharge, impotence, or testicular pain/masses. Musculoskeletal: No decreased ROM, myalgias, stiffness, joint swelling, or weakness. Skin: No rash, erythema, lesion changes, pain, warmth, jaundice, or pruritis. Neurological: No headache, dizziness, syncope, seizures, tremors, memory loss, coordination problems, or paresthesias. Psychological: No anxiety, depression, hallucinations, SI/HI. Endocrine: No fatigue, polydipsia, polyphagia, polyuria, or known diabetes. All other systems were reviewed and are otherwise negative.  Past Medical History:  Diagnosis Date  . Allergy    seasonal  . Anxiety    work related  . Asthma   . Erectile dysfunction   . GERD (gastroesophageal reflux disease)   . Heart murmur   . Tobacco abuse      Past Surgical History:  Procedure Laterality Date  . CARDIOVASCULAR STRESS TEST     cardiolite  . LEFT HEART CATHETERIZATION WITH CORONARY ANGIOGRAM N/A 08/03/2014   Procedure: LEFT HEART CATHETERIZATION  WITH CORONARY ANGIOGRAM;  Surgeon: Lennette Bihari, MD;  Location[facility] CATH LAB;  Service: Cardiovascular;  Laterality: N/A;  . US ECHOCARDIOGRAPHY  06/07/2004    Home Meds:  Outpatient Medications Prior to Visit  Medication Sig Dispense Refill  . acyclovir (ZOVIRAX) 400 MG tablet Take 400 mg by mouth as needed.    Marland Kitchen albuterol (PROVENTIL HFA;VENTOLIN HFA) 108 (90 Base) MCG/ACT inhaler Inhale 2 puffs into the lungs every 6 (six) hours as needed for wheezing or shortness of breath. 18 g 3  . ALPRAZolam (XANAX XR) 0.5 MG 24 hr tablet Take 1 tablet (0.5 mg total) by mouth as needed for anxiety. 30 tablet 0  . aspirin EC 81 MG tablet Take 1 tablet (81 mg total) by mouth daily.    . Cetirizine HCl (ZYRTEC ALLERGY PO) Take 10 mg by mouth daily.     Marland Kitchen doxycycline (VIBRA-TABS) 100 MG tablet Take 1 tablet (100 mg total) by mouth 2 (two) times daily. 14 tablet 0  . sildenafil (VIAGRA) 100 MG tablet Take 1 tablet (100 mg total) by mouth as directed. 24 tablet 3   No facility-administered medications prior to visit.     Allergies:  Allergies  Allergen Reactions  . Codeine Other (See Comments)    Stomach upset    Social History   Social History  . Marital status: Divorced    Spouse name: N/A  . Number of children: N/A  . 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 Smoking status: Current Some Day Smoker    Packs/day: 0.50    Types: Cigarettes  . Smokeless tobacco: Never Used  . Alcohol use 1.8 - 2.4 oz/week    2 Glasses of wine, 1 - 2 Standard drinks or  equivalent per week  . Drug use: No  . Sexual activity: Yes    Birth control/ protection: None   Other Topics Concern  . Not on file   Social History Narrative  . No narrative on file    Family History  Problem Relation Age of Onset  . CAD Mother   . Diabetes Mother   . Heart disease Mother   . Cancer Father   . Hypertension Father     Physical  Exam: Blood pressure (!) 148/110, pulse 85, temperature 97.7 F (36.5 C), temperature source Oral, resp. rate 18, weight 295 lb 6.4 oz (134 kg), SpO2 97 %.  General: Overweight WM.  Appears in no acute distress. HEENT: Normocephalic, atraumatic. Conjunctiva pink, sclera non-icteric. Pupils 2 mm constricting to 1 mm, round, regular, and equally reactive to light and accomodation. EOMI. Internal auditory canal clear. TMs with good cone of light and without pathology. Nasal mucosa pink. Nares are without discharge. No sinus tenderness. Oral mucosa pink. Pharynx without exudate.   Neck: Supple. Trachea midline. No thyromegaly. Full ROM. No lymphadenopathy. Lungs: Clear to auscultation bilaterally without wheezes, rales, or rhonchi. Breathing is of normal effort and unlabored. Cardiovascular: RRR with S1 S2. No murmurs, rubs, or gallops. Distal pulses 2+ symmetrically. No carotid or abdominal bruits. Abdomen: Soft, non-tender, non-distended with normoactive bowel sounds. No hepatosplenomegaly or masses. No rebound/guarding. No CVA tenderness. No hernias. Rectal: Deferred. Musculoskeletal: Full range of motion and 5/5 strength throughout. Without swelling, atrophy, tenderness, crepitus, or warmth. Extremities without clubbing, cyanosis, or edema. Calves supple. Skin: Warm and moist without erythema, ecchymosis, wounds, or rash. Neuro: A+Ox3. CN II-XII grossly intact. Moves all extremities spontaneously. Full sensation throughout. Normal gait. DTR 2+ throughout upper and lower extremities. Finger to nose intact. Psych:  Responds to questions appropriately with a normal affect.   Assessment/Plan:  51 y.o. y/o  white male here for CPE  -1. Visit for preventive health examination  A. Screening Labs: He is fasting.  - CBC with Differential/Platelet - COMPLETE METABOLIC PANEL WITH GFR - Lipid panel - TSH - VITAMIN D 25 Hydroxy (Vit-D Deficiency, Fractures) - PSA  B. Screening For Prostate Cancer: -  PSA  C. Screening For Colorectal Cancer:  Colonoscopy was performed at [facility] by Dr. Charlott Rakes on 07/20/2014. Showed diverticulosis in the sigmoid colon. Internal hemorrhoids. Repeat colonoscopy in 10 years for screening purposes.  D. Immunizations: Flu---------------------------------Discussed risks vs benefits, but he still defers.  Tetanus--------------------------He says he hasn't had any shots in very long time---recommended he get this, especially as he does frequently work outside and with metal etc--but he still defers Pneumococcal-----------------Given that he is a smoker, recommended this but her defers. Voices understanding but defers Zostavax--------------------------Not recommended until age 80   2. Atherosclerosis of native coronary artery of native heart without angina pectoris Per Cardiology  3. Obesity (BMI 35.0-39.9 without comorbidity) He has been educated regarding diet and exercise multiple visits here and a cardiology that he is noncompliant.  4. Tobacco abuse Discussed need for smoking cessation and discussed options of treatment but he defers today.  5. Essential hypertension Blood Pressure elevated. Reviewed his visit 02/2016 blood pressure 138/94 and 03/2016 was 135/87. Add enalapril 10 mg daily. Have him follow-up in 2 weeks to recheck BP and BMET - COMPLETE METABOLIC PANEL WITH GFR - enalapril (VASOTEC) 10 MG tablet; Take 1 tablet (10 mg total) by mouth daily.  Dispense: 30 tablet; Refill: 0  6. Screening for STDs (sexually transmitted diseases) -  GC/Chlamydia Probe Amp - Hepatitis panel, acute - HIV antibody - HSV(herpes smplx)abs-1+2(IgG+IgM)-bld - RPR  F/U Visit 2 weeks or sooner if needed.  Signed:   [address] Ridge [address]Mary Beth West WinfieldDixon,PA, [place]  11/20/2016 9:14 AM

## 2016-11-21 LAB — HIV ANTIBODY (ROUTINE TESTING W REFLEX): HIV 1&2 Ab, 4th Generation: NONREACTIVE

## 2016-11-21 LAB — VITAMIN D 25 HYDROXY (VIT D DEFICIENCY, FRACTURES): Vit D, 25-Hydroxy: 19 ng/mL — ABNORMAL LOW (ref 30–100)

## 2016-11-21 LAB — RPR

## 2016-11-21 LAB — HEPATITIS PANEL, ACUTE
HCV AB: NEGATIVE
HEP B C IGM: NONREACTIVE
HEP B S AG: NEGATIVE
Hep A IgM: NONREACTIVE

## 2016-11-27 ENCOUNTER — Other Ambulatory Visit: Payer: Self-pay

## 2016-11-27 DIAGNOSIS — E559 Vitamin D deficiency, unspecified: Secondary | ICD-10-CM | POA: Insufficient documentation

## 2016-11-27 MED ORDER — CHOLECALCIFEROL 100 MCG (4000 UT) PO CAPS: 1.0000 | ORAL_CAPSULE | Freq: Every day | ORAL | 0 refills | Status: DC

## 2016-12-01 ENCOUNTER — Telehealth: Payer: Self-pay

## 2016-12-01 DIAGNOSIS — I10 Essential (primary) hypertension: Secondary | ICD-10-CM

## 2016-12-01 MED ORDER — ENALAPRIL MALEATE 10 MG PO TABS: 10.0000 mg | ORAL_TABLET | Freq: Every day | ORAL | 0 refills | Status: DC

## 2016-12-01 NOTE — Telephone Encounter (Signed)
Patient requested a copy of his lab results from 3-5 as well as requested that his enalapril be sent to mail order with 90 day supply

## 2016-12-18 ENCOUNTER — Other Ambulatory Visit: Payer: Self-pay | Admitting: Physician Assistant

## 2016-12-18 DIAGNOSIS — I10 Essential (primary) hypertension: Secondary | ICD-10-CM

## 2016-12-18 NOTE — Telephone Encounter (Signed)
Refill appropriate 

## 2017-07-14 ENCOUNTER — Other Ambulatory Visit: Payer: Self-pay | Admitting: Family Medicine

## 2018-02-04 ENCOUNTER — Encounter: Payer: Self-pay | Admitting: Physician Assistant

## 2018-02-04 ENCOUNTER — Other Ambulatory Visit: Payer: Self-pay

## 2018-02-04 ENCOUNTER — Ambulatory Visit (INDEPENDENT_AMBULATORY_CARE_PROVIDER_SITE_OTHER): Payer: Managed Care, Other (non HMO) | Admitting: Physician Assistant

## 2018-02-04 VITALS — BP 120/100 | HR 78 | Temp 98.0°F | Resp 16 | Ht 72.0 in | Wt 290.0 lb

## 2018-02-04 DIAGNOSIS — E559 Vitamin D deficiency, unspecified: Secondary | ICD-10-CM

## 2018-02-04 DIAGNOSIS — I10 Essential (primary) hypertension: Secondary | ICD-10-CM | POA: Diagnosis not present

## 2018-02-04 DIAGNOSIS — Z0000 Encounter for general adult medical examination without abnormal findings: Secondary | ICD-10-CM

## 2018-02-04 DIAGNOSIS — I2510 Atherosclerotic heart disease of native coronary artery without angina pectoris: Secondary | ICD-10-CM

## 2018-02-04 DIAGNOSIS — E669 Obesity, unspecified: Secondary | ICD-10-CM | POA: Diagnosis not present

## 2018-02-04 DIAGNOSIS — Z720 Tobacco use: Secondary | ICD-10-CM

## 2018-02-04 MED ORDER — ENALAPRIL MALEATE 20 MG PO TABS: 20.0000 mg | ORAL_TABLET | Freq: Every day | ORAL | 0 refills | Status: DC

## 2018-02-04 NOTE — Progress Notes (Signed)
Patient ID: George Brock MRN: [id number], DOB: 1966-04-17 52 y.o. Date of Encounter: 02/04/2018, 8:46 AM    Chief Complaint: Physical (CPE)  HPI: 52 y.o. y/o male here for CPE.   I reviewed his CPE note from CPE with me 11/20/2016. At that visit blood pressure was elevated and I started enalapril 10 mg daily.  Today he states that his fiance is a CMA.  States that after that last visit in 11/2016 he took that blood pressure medication for a while but felt no difference so he quit it.  Says that his fiance found out about it a few weeks ago and he did start back to taking the medication daily over the last 3 weeks.  Medication is causing no lightheadedness or other adverse effects.  Reviewed that at last visit also had obesity.  Weight at that visit 11/2016 was 295.  Weight today 290.  Also reviewed that he is a smoker.  Today he states that he currently is smoking about 3/4 packs/day.  I asked if he would be interested in cessation/medication to help with cessation.  He states that he has tried multiple things in the past.  Had adverse effects with medications.  Used Chantix and then used Wellbutrin.  Also tried hypnosis and none of this was effective for him.  I reviewed that in history he has had a heart cath by Dr. Tresa Endo but I am unable to find those actual findings.  However another note and problem list states that his Cardiolite was false positive.  Assuming that cath showed minimal CAD.  He has no specific concerns to address today.  When we were discussing follow-up in 2 weeks he reports that he works 5:30 AM to 5:30 PM Monday through Friday.  Is getting married in about 6 weeks and does have a day off prior to the wedding so plans to schedule his follow-up visit during that time off !!  Even with exertion, he has had no chest pressure, chest heaviness, chest tightness and no increased shortness of breath/dyspnea on exertion.   Review of Systems: Consitutional: No fever,  chills, fatigue, night sweats, lymphadenopathy, or weight changes. Eyes: No visual changes, eye redness, or discharge. ENT/Mouth: Ears: No otalgia, tinnitus, hearing loss, discharge. Nose: No congestion, rhinorrhea, sinus pain, or epistaxis. Throat: No sore throat, post nasal drip, or teeth pain. Cardiovascular: No CP, palpitations, diaphoresis, DOE, edema, orthopnea, PND. Respiratory: No cough, hemoptysis, SOB, or wheezing. Gastrointestinal: No anorexia, dysphagia, reflux, pain, nausea, vomiting, hematemesis, diarrhea, constipation, BRBPR, or melena. Genitourinary: No dysuria, frequency, urgency, hematuria, incontinence, nocturia, decreased urinary stream, discharge, impotence, or testicular pain/masses. Musculoskeletal: No decreased ROM, myalgias, stiffness, joint swelling, or weakness. Skin: No rash, erythema, lesion changes, pain, warmth, jaundice, or pruritis. Neurological: No headache, dizziness, syncope, seizures, tremors, memory loss, coordination problems, or paresthesias. Psychological: No anxiety, depression, hallucinations, SI/HI. Endocrine: No fatigue, polydipsia, polyphagia, polyuria, or known diabetes. All other systems were reviewed and are otherwise negative.  Past Medical History:  Diagnosis Date  . Allergy    seasonal  . Anxiety    work related  . Asthma   . Erectile dysfunction   . GERD (gastroesophageal reflux disease)   . Heart murmur   . Tobacco abuse      Past Surgical History:  Procedure Laterality Date  . CARDIOVASCULAR STRESS TEST     cardiolite  . LEFT HEART CATHETERIZATION WITH CORONARY ANGIOGRAM N/A 08/03/2014   Procedure: LEFT HEART CATHETERIZATION WITH CORONARY ANGIOGRAM;  Surgeon:  Lennette Bihari, MD;  Location: [facility] CATH LAB;  Service: Cardiovascular;  Laterality: N/A;  . US ECHOCARDIOGRAPHY  06/07/2004    Home Meds:  Outpatient Medications Prior to Visit  Medication Sig Dispense Refill  . acyclovir (ZOVIRAX) 400 MG tablet Take 400 mg by mouth as  needed.    Marland Kitchen albuterol (PROVENTIL HFA;VENTOLIN HFA) 108 (90 Base) MCG/ACT inhaler Inhale 2 puffs into the lungs every 6 (six) hours as needed for wheezing or shortness of breath. 18 g 3  . aspirin EC 81 MG tablet Take 1 tablet (81 mg total) by mouth daily.    . Cetirizine HCl (ZYRTEC ALLERGY PO) Take 10 mg by mouth daily.     . Cholecalciferol 4000 units CAPS Take 1 capsule (4,000 Units total) by mouth daily. 30 capsule 0  . sildenafil (VIAGRA) 100 MG tablet TAKE 1 TABLET BY MOUTH AS DIRECTED -Do NOT take with nitrates or within 4 hours of an alpha BLOCKER 24 tablet 2  . doxycycline (VIBRA-TABS) 100 MG tablet Take 1 tablet (100 mg total) by mouth 2 (two) times daily. 14 tablet 0  . enalapril (VASOTEC) 10 MG tablet TAKE 1 TABLET BY MOUTH EVERY DAY 30 tablet 0  . ALPRAZolam (XANAX XR) 0.5 MG 24 hr tablet Take 1 tablet (0.5 mg total) by mouth as needed for anxiety. 30 tablet 0  . enalapril (VASOTEC) 10 MG tablet Take 1 tablet (10 mg total) by mouth daily. 90 tablet 0   No facility-administered medications prior to visit.     Allergies:  Allergies  Allergen Reactions  . Codeine Other (See Comments)    Stomach upset    Social History   Socioeconomic History  . Marital status: Divorced    Spouse name: Not on file  . Number of children: Not on file  . Years of education: Not on file  . Highest education level: Not on file  Occupational History  . Occupation: [employer], Technical sales engineer  Social Needs  . Financial resource strain: Not on file  . Food insecurity:    Worry: Not on file    Inability: Not on file  . Transportation needs:    Medical: Not on file    Non-medical: Not on file  Tobacco Use  . Smoking status: Current Some Day Smoker    Packs/day: 0.50    Types: Cigarettes  . Smokeless tobacco: Never Used  Substance and Sexual Activity  . Alcohol use: Yes    Alcohol/week: 1.8 - 2.4 oz    Types: 2 Glasses of wine, 1 - 2 Standard drinks or equivalent per week  . Drug  use: No  . Sexual activity: Yes    Birth control/protection: None  Lifestyle  . Physical activity:    Days per week: Not on file    Minutes per session: Not on file  . Stress: Not on file  Relationships  . Social connections:    Talks on phone: Not on file    Gets together: Not on file    Attends religious service: Not on file    Active member of club or organization: Not on file    Attends meetings of clubs or organizations: Not on file    Relationship status: Not on file  . Intimate partner violence:    Fear of current or ex partner: Not on file    Emotionally abused: Not on file    Physically abused: Not on file    Forced sexual activity: Not on file  Other  Topics Concern  . Not on file  Social History Narrative  . Not on file    Family History  Problem Relation Age of Onset  . CAD Mother   . Diabetes Mother   . Heart disease Mother   . Cancer Father   . Hypertension Father     Physical Exam: Blood pressure (!) 120/100, pulse 78, temperature 98 F (36.7 C), temperature source Oral, resp. rate 16, height 6' (1.829 m), weight 131.5 kg (290 lb), SpO2 95 %., Body mass index is 39.33 kg/m. General:  WM with abdominal obesity. Appears in no acute distress. Head: Normocephalic, atraumatic, eyes without discharge, sclera non-icteric, nares are without discharge. Bilateral auditory canals clear, TM's are without perforation, pearly grey and translucent with reflective cone of light bilaterally. Oral cavity moist, posterior pharynx without exudate, erythema.  Neck: Supple. No thyromegaly. No lymphadenopathy. No carotid bruits.  Lungs: Clear bilaterally to auscultation without wheezes, rales, or rhonchi. Breathing is unlabored. Heart: RRR with S1 S2. No murmurs, rubs, or gallops. Abdomen: Soft, non-tender, non-distended with normoactive bowel sounds. No hepatomegaly. No rebound/guarding. No obvious abdominal masses. Musculoskeletal:  Strength and tone normal for  age. Extremities/Skin: Warm and dry. No clubbing or cyanosis. No edema. No rashes or suspicious lesions. Neuro: Alert and oriented X 3. Moves all extremities spontaneously. Gait is normal. CNII-XII grossly in tact. Psych:  Responds to questions appropriately with a normal affect.   Assessment/Plan:  52 y.o. y/o  white male here for CPE  -1. Visit for preventive health examination  A. Screening Labs: 02/04/2018: He is fasting.  - CBC with Differential/Platelet - COMPLETE METABOLIC PANEL WITH GFR - Lipid panel - TSH - VITAMIN D 25 Hydroxy (Vit-D Deficiency, Fractures) - PSA  B. Screening For Prostate Cancer: 02/04/2018: - PSA  C. Screening For Colorectal Cancer:  02/04/2018: Colonoscopy was performed at [facility] endoscopy [facility] by Dr. Charlott Rakes on 07/20/2014. Showed diverticulosis in the sigmoid colon. Internal hemorrhoids. Repeat colonoscopy in 10 years for screening purposes.  D. Immunizations: Flu---------------------------------Discussed risks vs benefits, but he still defers.  Tetanus--------------------------He says he hasn't had any shots in very long time---recommended he get this, especially as he does frequently work outside and with metal etc--but he still defers ---tetanus: 02/04/2018: Discussed and recommended this again today but again he defers/refuses Pneumococcal-----------------Given that he is a smoker, recommended this but her defers. Voices understanding but defers --Pneumonia: 02/04/2018: Discussed and recommended this again today but again he defers/refuses  Zostavax--------------------------Not recommended until age 83   2. Atherosclerosis of native coronary artery of native heart without angina pectoris 02/04/2018: Per Cardiology  3. Obesity (BMI 35.0-39.9 without comorbidity) 02/04/2018: He has been educated regarding diet and exercise multiple visits here and a cardiology that he is noncompliant.  4. Tobacco abuse Discussed need for smoking cessation  and discussed options of treatment but he defers today. 02/04/2018: He states that in the past he has used Chantix and then a later time Wellbutrin and another time hypnosis.  Had adverse effects with Chantix and Wellbutrin and was not successful with using hypnosis.  5. Essential hypertension 02/04/2018: Nurse BP checked today is elevated.  I rechecked myself and am getting 130/100 on the left.  At this time I am having him increase enalapril from 10 mg to 20 mg daily.  He is to return for OV in 2 weeks to recheck BP and bmet on increased dose of med.  F/U Visit 2 weeks or sooner if needed.  Signed:   [facility]  [place],PA, BSFM  02/04/2018 8:46 AM

## 2018-02-05 LAB — COMPLETE METABOLIC PANEL WITH GFR
AG Ratio: 1.5 (calc) (ref 1.0–2.5)
ALT: 14 U/L (ref 9–46)
AST: 15 U/L (ref 10–35)
Albumin: 4 g/dL (ref 3.6–5.1)
Alkaline phosphatase (APISO): 66 U/L (ref 40–115)
BILIRUBIN TOTAL: 0.5 mg/dL (ref 0.2–1.2)
BUN: 14 mg/dL (ref 7–25)
CALCIUM: 9.1 mg/dL (ref 8.6–10.3)
CHLORIDE: 104 mmol/L (ref 98–110)
CO2: 24 mmol/L (ref 20–32)
Creat: 0.88 mg/dL (ref 0.70–1.33)
GFR, EST NON AFRICAN AMERICAN: 98 mL/min/{1.73_m2} (ref >=60)
GFR, Est African American: 114 mL/min/{1.73_m2} (ref >=60)
GLUCOSE: 96 mg/dL (ref 65–99)
Globulin: 2.6 g/dL (calc) (ref 1.9–3.7)
POTASSIUM: 4.5 mmol/L (ref 3.5–5.3)
Sodium: 137 mmol/L (ref 135–146)
TOTAL PROTEIN: 6.6 g/dL (ref 6.1–8.1)

## 2018-02-05 LAB — CBC WITH DIFFERENTIAL/PLATELET
BASOS PCT: 0.7 %
Basophils Absolute: 41 cells/uL (ref 0–200)
EOS PCT: 3.2 %
Eosinophils Absolute: 189 cells/uL (ref 15–500)
HCT: 46 % (ref 38.5–50.0)
Hemoglobin: 16 g/dL (ref 13.2–17.1)
Lymphs Abs: 1628 cells/uL (ref 850–3900)
MCH: 31.9 pg (ref 27.0–33.0)
MCHC: 34.8 g/dL (ref 32.0–36.0)
MCV: 91.8 fL (ref 80.0–100.0)
MONOS PCT: 6.8 %
MPV: 9.2 fL (ref 7.5–12.5)
NEUTROS PCT: 61.7 %
Neutro Abs: 3640 cells/uL (ref 1500–7800)
PLATELETS: 217 10*3/uL (ref 140–400)
RBC: 5.01 10*6/uL (ref 4.20–5.80)
RDW: 13 % (ref 11.0–15.0)
TOTAL LYMPHOCYTE: 27.6 %
WBC: 5.9 10*3/uL (ref 3.8–10.8)
WBCMIX: 401 {cells}/uL (ref 200–950)

## 2018-02-05 LAB — LIPID PANEL
Cholesterol: 119 mg/dL (ref <200)
HDL: 37 mg/dL — AB (ref >40)
LDL CHOLESTEROL (CALC): 67 mg/dL
Non-HDL Cholesterol (Calc): 82 mg/dL (calc) (ref <130)
TRIGLYCERIDES: 73 mg/dL (ref <150)
Total CHOL/HDL Ratio: 3.2 (calc) (ref <5.0)

## 2018-02-05 LAB — VITAMIN D 25 HYDROXY (VIT D DEFICIENCY, FRACTURES): Vit D, 25-Hydroxy: 22 ng/mL — ABNORMAL LOW (ref 30–100)

## 2018-02-05 LAB — PSA: PSA: 0.4 ng/mL (ref <=4.0)

## 2018-02-05 LAB — TSH: TSH: 1.93 mIU/L (ref 0.40–4.50)

## 2018-02-08 ENCOUNTER — Other Ambulatory Visit: Payer: Self-pay

## 2018-02-08 MED ORDER — VITAMIN D 1000 UNITS PO TABS: 1000.0000 [IU] | ORAL_TABLET | Freq: Every day | ORAL | Status: DC

## 2018-02-25 ENCOUNTER — Encounter: Payer: Self-pay | Admitting: Physician Assistant

## 2018-02-25 ENCOUNTER — Ambulatory Visit: Payer: Managed Care, Other (non HMO) | Admitting: Physician Assistant

## 2018-02-25 VITALS — BP 126/94 | HR 90 | Temp 98.4°F | Resp 16 | Ht 72.0 in | Wt 291.2 lb

## 2018-02-25 DIAGNOSIS — E669 Obesity, unspecified: Secondary | ICD-10-CM | POA: Diagnosis not present

## 2018-02-25 DIAGNOSIS — E559 Vitamin D deficiency, unspecified: Secondary | ICD-10-CM | POA: Diagnosis not present

## 2018-02-25 DIAGNOSIS — I10 Essential (primary) hypertension: Secondary | ICD-10-CM

## 2018-02-25 DIAGNOSIS — Z720 Tobacco use: Secondary | ICD-10-CM | POA: Diagnosis not present

## 2018-02-25 MED ORDER — ENALAPRIL MALEATE 20 MG PO TABS: 20.0000 mg | ORAL_TABLET | Freq: Every day | ORAL | 1 refills | Status: DC

## 2018-02-25 NOTE — Progress Notes (Signed)
Patient ID: George Brock MRN: [id number], DOB: 1966/03/30 52 y.o. Date of Encounter: 02/25/2018, 4:26 PM    Chief Complaint: Physical (CPE)  HPI: 52 y.o. y/o male    02/04/2018:  here for CPE.   I reviewed his CPE note from CPE with me 11/20/2016. At that visit blood pressure was elevated and I started enalapril 10 mg daily.  Today he states that his fiance is a CMA.  States that after that last visit in 11/2016 he took that blood pressure medication for a while but felt no difference so he quit it.  Says that his fiance found out about it a few weeks ago and he did start back to taking the medication daily over the last 3 weeks.  Medication is causing no lightheadedness or other adverse effects.  Reviewed that at last visit also had obesity.  Weight at that visit 11/2016 was 295.  Weight today 290.  Also reviewed that he is a smoker.  Today he states that he currently is smoking about 3/4 packs/day.  I asked if he would be interested in cessation/medication to help with cessation.  He states that he has tried multiple things in the past.  Had adverse effects with medications.  Used Chantix and then used Wellbutrin.  Also tried hypnosis and none of this was effective for him.  I reviewed that in history he has had a heart cath by Dr. Claiborne Billings but I am unable to find those actual findings.  However another note and problem list states that his Cardiolite was false positive.  Assuming that cath showed minimal CAD.  He has no specific concerns to address today.  When we were discussing follow-up in 2 weeks he reports that he works 5:30 AM to 5:30 PM Monday through Friday.  Is getting married in about 6 weeks and does have a day off prior to the wedding so plans to schedule his follow-up visit during that time off !!  Even with exertion, he has had no chest pressure, chest heaviness, chest tightness and no increased shortness of breath/dyspnea on exertion.   AT THAT OV: -- Checked  screening labs and updated preventive care. --Increased enalapril from 10 mg to 20 mg.  Planned for him to return in 2 weeks to recheck BP and be met on increased dose of med.   --------------------------------------------------------------------------------------------------------------------------------------------------------------------------------------------------------------------  02/25/2018: Today he reports that he did increase the enalapril to 20 mg daily and has been taking this dose.  Having no adverse effects. Also reviewed that labs showed vitamin D low at 22.  Recommended that he restart vitamin D at 1000 units daily.  Today he reports that he started taking a Centrum multivitamin and that if he takes 2 of these a day that will total 1000 units/day.  Other labs were normal.  He has no other concerns to address today.     Review of Systems: Consitutional: No fever, chills, fatigue, night sweats, lymphadenopathy, or weight changes. Eyes: No visual changes, eye redness, or discharge. ENT/Mouth: Ears: No otalgia, tinnitus, hearing loss, discharge. Nose: No congestion, rhinorrhea, sinus pain, or epistaxis. Throat: No sore throat, post nasal drip, or teeth pain. Cardiovascular: No CP, palpitations, diaphoresis, DOE, edema, orthopnea, PND. Respiratory: No cough, hemoptysis, SOB, or wheezing. Gastrointestinal: No anorexia, dysphagia, reflux, pain, nausea, vomiting, hematemesis, diarrhea, constipation, BRBPR, or melena. Genitourinary: No dysuria, frequency, urgency, hematuria, incontinence, nocturia, decreased urinary stream, discharge, impotence, or testicular pain/masses. Musculoskeletal: No decreased ROM, myalgias, stiffness, joint swelling, or weakness.  Skin: No rash, erythema, lesion changes, pain, warmth, jaundice, or pruritis. Neurological: No headache, dizziness, syncope, seizures, tremors, memory loss, coordination problems, or paresthesias. Psychological: No anxiety,  depression, hallucinations, SI/HI. Endocrine: No fatigue, polydipsia, polyphagia, polyuria, or known diabetes. All other systems were reviewed and are otherwise negative.  Past Medical History:  Diagnosis Date  . Allergy    seasonal  . Anxiety    work related  . Asthma   . Erectile dysfunction   . GERD (gastroesophageal reflux disease)   . Heart murmur   . Tobacco abuse      Past Surgical History:  Procedure Laterality Date  . CARDIOVASCULAR STRESS TEST     cardiolite  . LEFT HEART CATHETERIZATION WITH CORONARY ANGIOGRAM N/A 08/03/2014   Procedure: LEFT HEART CATHETERIZATION WITH CORONARY ANGIOGRAM;  Surgeon: Troy Sine, MD;  Location: [facility] CATH LAB;  Service: Cardiovascular;  Laterality: N/A;  . US ECHOCARDIOGRAPHY  06/07/2004    Home Meds:  Outpatient Medications Prior to Visit  Medication Sig Dispense Refill  . acyclovir (ZOVIRAX) 400 MG tablet Take 400 mg by mouth as needed.    Marland Kitchen albuterol (PROVENTIL HFA;VENTOLIN HFA) 108 (90 Base) MCG/ACT inhaler Inhale 2 puffs into the lungs every 6 (six) hours as needed for wheezing or shortness of breath. 18 g 3  . aspirin EC 81 MG tablet Take 1 tablet (81 mg total) by mouth daily.    . Cetirizine HCl (ZYRTEC ALLERGY PO) Take 10 mg by mouth daily.     . cholecalciferol (VITAMIN D) 1000 units tablet Take 1 tablet (1,000 Units total) by mouth daily.    . enalapril (VASOTEC) 20 MG tablet Take 1 tablet (20 mg total) by mouth daily. 30 tablet 0  . sildenafil (VIAGRA) 100 MG tablet TAKE 1 TABLET BY MOUTH AS DIRECTED -Do NOT take with nitrates or within 4 hours of an alpha BLOCKER 24 tablet 2   No facility-administered medications prior to visit.     Allergies:  Allergies  Allergen Reactions  . Codeine Other (See Comments)    Stomach upset    Social History   Socioeconomic History  . Marital status: Divorced    Spouse name: Not on file  . Number of children: Not on file  . Years of education: Not on file  . Highest education  level: Not on file  Occupational History  . Occupation: [employer], Secondary school teacher  Social Needs  . Financial resource strain: Not on file  . Food insecurity:    Worry: Not on file    Inability: Not on file  . Transportation needs:    Medical: Not on file    Non-medical: Not on file  Tobacco Use  . Smoking status: Current Some Day Smoker    Packs/day: 0.50    Types: Cigarettes  . Smokeless tobacco: Never Used  Substance and Sexual Activity  . Alcohol use: Yes    Alcohol/week: 1.8 - 2.4 oz    Types: 2 Glasses of wine, 1 - 2 Standard drinks or equivalent per week  . Drug use: No  . Sexual activity: Yes    Birth control/protection: None  Lifestyle  . Physical activity:    Days per week: Not on file    Minutes per session: Not on file  . Stress: Not on file  Relationships  . Social connections:    Talks on phone: Not on file    Gets together: Not on file    Attends religious service: Not on file  Active member of club or organization: Not on file    Attends meetings of clubs or organizations: Not on file    Relationship status: Not on file  . Intimate partner violence:    Fear of current or ex partner: Not on file    Emotionally abused: Not on file    Physically abused: Not on file    Forced sexual activity: Not on file  Other Topics Concern  . Not on file  Social History Narrative  . Not on file    Family History  Problem Relation Age of Onset  . CAD Mother   . Diabetes Mother   . Heart disease Mother   . Cancer Father   . Hypertension Father     Physical Exam: Blood pressure (!) 126/94, pulse 90, temperature 98.4 F (36.9 C), temperature source Oral, resp. rate 16, height 6' (1.829 m), weight 132.1 kg (291 lb 3.2 oz), SpO2 96 %., Body mass index is 39.49 kg/m. General: Appears in no acute distress. Neck: Supple. No thyromegaly. No lymphadenopathy.  No carotid bruit. Lungs: Clear bilaterally to auscultation without wheezes, rales, or rhonchi.  Breathing is unlabored. Heart: RRR with S1 S2. No murmurs, rubs, or gallops. Musculoskeletal:  Strength and tone normal for age. Extremities/Skin: Warm and dry.  Neuro: Alert and oriented X 3. Moves all extremities spontaneously. Gait is normal. CNII-XII grossly in tact. Psych:  Responds to questions appropriately with a normal affect.   Assessment/Plan:  52 y.o. y/o  white male here for    1. Essential hypertension 02/25/2018: Blood pressure is controlled.  Will continue enalapril at 20 mg daily.  Will recheck bmet since dose changed last visit. - BASIC METABOLIC PANEL WITH GFR - enalapril (VASOTEC) 20 MG tablet; Take 1 tablet (20 mg total) by mouth daily.  Dispense: 90 tablet; Refill: 1  Vitamin D deficiency 02/25/2018: Vitamin D level was low at lab 02/04/2018.  Today he reports that he is taking a Centrum 2 per day to equal a total of 1000 units of vitamin D daily.  Atherosclerosis of native coronary artery of native heart without angina pectoris 02/04/2018: Per Cardiology  Obesity (BMI 35.0-39.9 without comorbidity) 02/04/2018: He has been educated regarding diet and exercise multiple visits here and a cardiology that he is noncompliant.  Tobacco abuse Discussed need for smoking cessation and discussed options of treatment but he defers today. 02/04/2018: He states that in the past he has used Chantix and then a later time Wellbutrin and another time hypnosis.  Had adverse effects with Chantix and Wellbutrin and was not successful with using hypnosis.     ---------------THE FOLLOWING IS COPIED FROM HIS CPE 02/04/2018:--------------------------------------  Visit for preventive health examination  A. Screening Labs: 02/04/2018: He is fasting.  - CBC with Differential/Platelet - COMPLETE METABOLIC PANEL WITH GFR - Lipid panel - TSH - VITAMIN D 25 Hydroxy (Vit-D Deficiency, Fractures) - PSA  B. Screening For Prostate Cancer: 02/04/2018: - PSA  C. Screening For Colorectal  Cancer:  02/04/2018: Colonoscopy was performed at [facility] endoscopy [facility] by Dr. Wilford Corner on 07/20/2014. Showed diverticulosis in the sigmoid colon. Internal hemorrhoids. Repeat colonoscopy in 10 years for screening purposes.  D. Immunizations: Flu---------------------------------Discussed risks vs benefits, but he still defers.  Tetanus--------------------------He says he hasn't had any shots in very long time---recommended he get this, especially as he does frequently work outside and with metal etc--but he still defers ---tetanus: 02/04/2018: Discussed and recommended this again today but again he defers/refuses Pneumococcal-----------------Given that he is  a smoker, recommended this but her defers. Voices understanding but defers --Pneumonia: 02/04/2018: Discussed and recommended this again today but again he defers/refuses  Zostavax--------------------------Not recommended until age 28    Signed:   [address] [place], [place]  02/25/2018 4:26 PM

## 2018-02-26 LAB — BASIC METABOLIC PANEL WITH GFR
BUN: 18 mg/dL (ref 7–25)
CO2: 25 mmol/L (ref 20–32)
CREATININE: 0.99 mg/dL (ref 0.70–1.33)
Calcium: 10 mg/dL (ref 8.6–10.3)
Chloride: 103 mmol/L (ref 98–110)
GFR, Est African American: 100 mL/min/{1.73_m2} (ref >=60)
GFR, Est Non African American: 87 mL/min/{1.73_m2} (ref >=60)
GLUCOSE: 84 mg/dL (ref 65–99)
Potassium: 4.6 mmol/L (ref 3.5–5.3)
SODIUM: 140 mmol/L (ref 135–146)

## 2018-02-26 LAB — EXTRA LAV TOP TUBE

## 2018-08-28 ENCOUNTER — Ambulatory Visit: Payer: Managed Care, Other (non HMO) | Admitting: Physician Assistant

## 2018-08-30 ENCOUNTER — Ambulatory Visit: Payer: Managed Care, Other (non HMO) | Admitting: Family Medicine

## 2018-09-02 ENCOUNTER — Other Ambulatory Visit: Payer: Self-pay | Admitting: *Deleted

## 2018-09-02 MED ORDER — SILDENAFIL CITRATE 100 MG PO TABS: ORAL_TABLET | ORAL | 2 refills | Status: DC

## 2018-09-02 NOTE — Telephone Encounter (Signed)
Received fax requesting refill on Sildenafil.   Ok to refill? 

## 2018-09-24 ENCOUNTER — Encounter: Payer: Self-pay | Admitting: Family Medicine

## 2018-09-24 ENCOUNTER — Ambulatory Visit: Payer: Managed Care, Other (non HMO) | Admitting: Family Medicine

## 2018-09-24 VITALS — BP 128/88 | HR 85 | Temp 98.0°F | Resp 18 | Ht 72.0 in | Wt 296.0 lb

## 2018-09-24 DIAGNOSIS — J209 Acute bronchitis, unspecified: Secondary | ICD-10-CM

## 2018-09-24 DIAGNOSIS — J069 Acute upper respiratory infection, unspecified: Secondary | ICD-10-CM

## 2018-09-24 DIAGNOSIS — R6889 Other general symptoms and signs: Secondary | ICD-10-CM | POA: Diagnosis not present

## 2018-09-24 DIAGNOSIS — J320 Chronic maxillary sinusitis: Secondary | ICD-10-CM | POA: Diagnosis not present

## 2018-09-24 DIAGNOSIS — B9789 Other viral agents as the cause of diseases classified elsewhere: Secondary | ICD-10-CM

## 2018-09-24 LAB — INFLUENZA A AND B AG, IMMUNOASSAY
INFLUENZA A ANTIGEN: NOT DETECTED
INFLUENZA B ANTIGEN: NOT DETECTED

## 2018-09-24 MED ORDER — PREDNISONE 20 MG PO TABS: ORAL_TABLET | ORAL | 0 refills | Status: DC

## 2018-09-24 MED ORDER — AMOXICILLIN 875 MG PO TABS: 875.0000 mg | ORAL_TABLET | Freq: Two times a day (BID) | ORAL | 0 refills | Status: DC

## 2018-09-24 NOTE — Progress Notes (Signed)
Subjective:    Patient ID: George Brock, male    DOB: 1965-11-17, 53 y.o.   MRN: [id number]  HPI Patient is a 53 year old white male with a history of tobacco abuse who presents today with flulike symptoms.  1 of his coworkers was diagnosed with the flu last week.  They were working in close proximity.  Beginning Saturday, the patient developed head congestion and rhinorrhea and sinus pressure primarily in his left frontal and left maxillary sinus.  Sunday he developed fatigue.  Monday he developed diffuse body aches, worsening rhinorrhea, and coughing.  He denies any high fever although he does complain of a headache body aches and cough.  He denies any shortness of breath.  Cough is productive of clear sputum.  He denies any purulent sputum however on exam, he has markedly diminished breath sounds throughout and diffuse expiratory wheezing.  He also has some left maxillary sinus tenderness to percussion Past Medical History:  Diagnosis Date  . Allergy    seasonal  . Anxiety    work related  . Asthma   . Erectile dysfunction   . GERD (gastroesophageal reflux disease)   . Heart murmur   . Tobacco abuse   Patient had abnormal nuclear perfusion images in 2015 and subsequently underwent cardiac catheterization which showed normal coronary arteries. Past Surgical History:  Procedure Laterality Date  . CARDIOVASCULAR STRESS TEST     cardiolite  . LEFT HEART CATHETERIZATION WITH CORONARY ANGIOGRAM N/A 08/03/2014   Procedure: LEFT HEART CATHETERIZATION WITH CORONARY ANGIOGRAM;  Surgeon: Lennette Bihari, MD;  Location: [facility] CATH LAB;  Service: Cardiovascular;  Laterality: N/A;  . US ECHOCARDIOGRAPHY  06/07/2004   Current Outpatient Medications on File Prior to Visit  Medication Sig Dispense Refill  . acyclovir (ZOVIRAX) 400 MG tablet Take 400 mg by mouth as needed.    Marland Kitchen albuterol (PROVENTIL HFA;VENTOLIN HFA) 108 (90 Base) MCG/ACT inhaler Inhale 2 puffs into the lungs every 6 (six) hours as  needed for wheezing or shortness of breath. 18 g 3  . aspirin EC 81 MG tablet Take 1 tablet (81 mg total) by mouth daily.    . Cetirizine HCl (ZYRTEC ALLERGY PO) Take 10 mg by mouth daily.     . cholecalciferol (VITAMIN D) 1000 units tablet Take 1 tablet (1,000 Units total) by mouth daily.    . enalapril (VASOTEC) 20 MG tablet Take 1 tablet (20 mg total) by mouth daily. 90 tablet 1  . sildenafil (VIAGRA) 100 MG tablet TAKE 1 TABLET BY MOUTH AS DIRECTED -Do NOT take with nitrates or within 4 hours of an alpha BLOCKER 24 tablet 2   No current facility-administered medications on file prior to visit.    Allergies  Allergen Reactions  . Codeine Other (See Comments)    Stomach upset   Social History   Socioeconomic History  . Marital status: Divorced    Spouse name: Not on file  . Number of children: Not on file  . Years of education: Not on file  . Highest education level: Not on file  Occupational History  . Occupation: [employer], Technical sales engineer  Social Needs  . Financial resource strain: Not on file  . Food insecurity:    Worry: Not on file    Inability: Not on file  . Transportation needs:    Medical: Not on file    Non-medical: Not on file  Tobacco Use  . Smoking status: Current Some Day Smoker    Packs/day: 0.50  Types: Cigarettes  . Smokeless tobacco: Never Used  Substance and Sexual Activity  . Alcohol use: Yes    Alcohol/week: 3.0 - 4.0 standard drinks    Types: 2 Glasses of wine, 1 - 2 Standard drinks or equivalent per week  . Drug use: No  . Sexual activity: Yes    Birth control/protection: None  Lifestyle  . Physical activity:    Days per week: Not on file    Minutes per session: Not on file  . Stress: Not on file  Relationships  . Social connections:    Talks on phone: Not on file    Gets together: Not on file    Attends religious service: Not on file    Active member of club or organization: Not on file    Attends meetings of clubs or  organizations: Not on file    Relationship status: Not on file  . Intimate partner violence:    Fear of current or ex partner: Not on file    Emotionally abused: Not on file    Physically abused: Not on file    Forced sexual activity: Not on file  Other Topics Concern  . Not on file  Social History Narrative  . Not on file      Review of Systems  All other systems reviewed and are negative.      Objective:   Physical Exam Vitals signs reviewed.  Constitutional:      General: He is not in acute distress.    Appearance: Normal appearance. He is obese. He is ill-appearing. He is not toxic-appearing.  HENT:     Head: Normocephalic and atraumatic.     Right Ear: Tympanic membrane, ear canal and external ear normal.     Left Ear: Tympanic membrane, ear canal and external ear normal.     Nose: Congestion and rhinorrhea present.     Left Sinus: Maxillary sinus tenderness and frontal sinus tenderness present.     Mouth/Throat:     Mouth: Mucous membranes are moist.     Pharynx: No oropharyngeal exudate or posterior oropharyngeal erythema.  Eyes:     General:        Right eye: No discharge.        Left eye: No discharge.     Conjunctiva/sclera: Conjunctivae normal.  Neck:     Musculoskeletal: Normal range of motion and neck supple.  Cardiovascular:     Rate and Rhythm: Normal rate and regular rhythm.     Pulses: Normal pulses.     Heart sounds: Normal heart sounds. No murmur. No friction rub. No gallop.   Pulmonary:     Effort: Pulmonary effort is normal.     Breath sounds: Decreased air movement present. Examination of the right-upper field reveals wheezing. Examination of the left-upper field reveals wheezing. Examination of the right-middle field reveals wheezing. Examination of the left-middle field reveals wheezing. Examination of the right-lower field reveals wheezing. Examination of the left-lower field reveals wheezing. Wheezing present. No rhonchi or rales.  Abdominal:      General: Bowel sounds are normal.     Palpations: Abdomen is soft.     Tenderness: There is no abdominal tenderness. There is no guarding or rebound.  Lymphadenopathy:     Cervical: No cervical adenopathy.  Skin:    Findings: No rash.  Neurological:     Mental Status: He is alert.           Assessment & Plan:  Symptoms are consistent  with a viral upper respiratory infection complicated by left sinusitis and bronchospasm likely due to a possible mild underlying emphysema that is yet to be diagnosed.  I would treat the bronchospasm with a combination of prednisone taper pack and albuterol 2 puffs inhaled every 4-6 hours as needed for wheezing.  I will check a flu test however I do not believe this is the flu clinically.  Patient's sinus infection does not yet meet criteria for antibiotics however if symptoms persist greater than 10 days, I will treat him with amoxicillin 875 mg p.o. twice daily for 10 days.  Flu test is negative.  Patient will rest, take prednisone and albuterol for the bronchospasm, recommended smoking cessation, if sinus pain persist greater than 10 days or worsens he will start amoxicillin

## 2018-10-16 ENCOUNTER — Telehealth: Payer: Self-pay | Admitting: Family Medicine

## 2018-10-16 MED ORDER — OSELTAMIVIR PHOSPHATE 75 MG PO CAPS: 75.0000 mg | ORAL_CAPSULE | Freq: Every day | ORAL | 0 refills | Status: AC

## 2018-10-16 NOTE — Telephone Encounter (Signed)
Pt called and his daughter was dx with type b flu and would like Tamiflu called in for preventive please. His wife has already been treated with Tamiflu. Per Dr. Tanya Nones ok to send - med sent to pharm.

## 2019-04-25 ENCOUNTER — Other Ambulatory Visit: Payer: Self-pay

## 2019-04-28 ENCOUNTER — Encounter: Payer: Self-pay | Admitting: Family Medicine

## 2019-04-28 ENCOUNTER — Ambulatory Visit (INDEPENDENT_AMBULATORY_CARE_PROVIDER_SITE_OTHER): Payer: Managed Care, Other (non HMO) | Admitting: Family Medicine

## 2019-04-28 ENCOUNTER — Other Ambulatory Visit: Payer: Self-pay

## 2019-04-28 VITALS — BP 136/90 | HR 94 | Temp 98.4°F | Resp 18 | Ht 72.0 in | Wt 292.0 lb

## 2019-04-28 DIAGNOSIS — Z0001 Encounter for general adult medical examination with abnormal findings: Secondary | ICD-10-CM | POA: Diagnosis not present

## 2019-04-28 DIAGNOSIS — Z125 Encounter for screening for malignant neoplasm of prostate: Secondary | ICD-10-CM

## 2019-04-28 DIAGNOSIS — M25561 Pain in right knee: Secondary | ICD-10-CM

## 2019-04-28 DIAGNOSIS — I10 Essential (primary) hypertension: Secondary | ICD-10-CM | POA: Diagnosis not present

## 2019-04-28 DIAGNOSIS — I2510 Atherosclerotic heart disease of native coronary artery without angina pectoris: Secondary | ICD-10-CM

## 2019-04-28 DIAGNOSIS — Z0000 Encounter for general adult medical examination without abnormal findings: Secondary | ICD-10-CM

## 2019-04-28 MED ORDER — CHANTIX STARTING MONTH PAK 0.5 MG X 11 & 1 MG X 42 PO TABS: ORAL_TABLET | ORAL | 0 refills | Status: DC

## 2019-04-28 NOTE — Addendum Note (Signed)
Addended by: Shary Decamp B on: 04/28/2019 10:12 AM   Modules accepted: Orders

## 2019-04-28 NOTE — Progress Notes (Signed)
Subjective:    Patient ID: George Brock, male    DOB: 03/23/1966, 53 y.o.   MRN: [id number]  HPI Patient is a 53 year old male here today for complete physical exam.  Per his past medical records, he had a positive stress test and ultimately went catheterization was found to have "angiographically normal" coronary arteries.  Last colonoscopy was performed in November 2015 and revealed diverticulosis.  He is not due for repeat colonoscopy until 2025.  He is due for prostate cancer screening.  Patient complains of pain in his right knee.  He states that his knee is been hurting for quite some time.  Over the last several weeks the pain is become severe.  He is alternating Tylenol and ibuprofen with little relief.  The pain is primarily located around the patella on both the medial and lateral joint lines.  He denies any erythema.  He denies any effusion.  He denies any locking or laxity in the joint.  He denies any specific injury.  He smokes approximately one half a pack to 1 pack of cigarettes per day. Past Medical History:  Diagnosis Date  . Allergy    seasonal  . Anxiety    work related  . Asthma   . Erectile dysfunction   . GERD (gastroesophageal reflux disease)   . Heart murmur   . Tobacco abuse    Past Surgical History:  Procedure Laterality Date  . CARDIOVASCULAR STRESS TEST     cardiolite  . LEFT HEART CATHETERIZATION WITH CORONARY ANGIOGRAM N/A 08/03/2014   Procedure: LEFT HEART CATHETERIZATION WITH CORONARY ANGIOGRAM;  Surgeon: Lennette Biharihomas A Kelly, MD;  [facility];  Service: Cardiovascular;  Laterality: N/A;  . US ECHOCARDIOGRAPHY  06/07/2004   Current Outpatient Medications on File Prior to Visit  Medication Sig Dispense Refill  . albuterol (PROVENTIL HFA;VENTOLIN HFA) 108 (90 Base) MCG/ACT inhaler Inhale 2 puffs into the lungs every 6 (six) hours as needed for wheezing or shortness of breath. 18 g 3  . aspirin EC 81 MG tablet Take 1 tablet (81 mg total) by mouth  daily.    . Cetirizine HCl (ZYRTEC ALLERGY PO) Take 10 mg by mouth daily.     . enalapril (VASOTEC) 20 MG tablet Take 1 tablet (20 mg total) by mouth daily. 90 tablet 1  . sildenafil (VIAGRA) 100 MG tablet TAKE 1 TABLET BY MOUTH AS DIRECTED -Do NOT take with nitrates or within 4 hours of an alpha BLOCKER 24 tablet 2   No current facility-administered medications on file prior to visit.    Allergies  Allergen Reactions  . Codeine Other (See Comments)    Stomach upset   Social History   Socioeconomic History  . Marital status: Divorced    Spouse name: Not on file  . Number of children: Not on file  . Years of education: Not on file  . Highest education level: Not on file  Occupational History  . Occupation: George Brock, Technical sales engineeracilities Manager  Social Needs  . Financial resource strain: Not on file  . Food insecurity    Worry: Not on file    Inability: Not on file  . Transportation needs    Medical: Not on file    Non-medical: Not on file  Tobacco Use  . Smoking status: Current Some Day Smoker    Packs/day: 0.50    Types: Cigarettes  . Smokeless tobacco: Never Used  Substance and Sexual Activity  . Alcohol use: Yes    Alcohol/week:  3.0 - 4.0 standard drinks    Types: 2 Glasses of wine, 1 - 2 Standard drinks or equivalent per week  . Drug use: No  . Sexual activity: Yes    Birth control/protection: None  Lifestyle  . Physical activity    Days per week: Not on file    Minutes per session: Not on file  . Stress: Not on file  Relationships  . Social Musicianconnections    Talks on phone: Not on file    Gets together: Not on file    Attends religious service: Not on file    Active member of club or organization: Not on file    Attends meetings of clubs or organizations: Not on file    Relationship status: Not on file  . Intimate partner violence    Fear of current or ex partner: Not on file    Emotionally abused: Not on file    Physically abused: Not on file    Forced sexual  activity: Not on file  Other Topics Concern  . Not on file  Social History Narrative  . Not on file   Family History  Problem Relation Age of Onset  . CAD Mother   . Diabetes Mother   . Heart disease Mother   . Cancer Father   . Hypertension Father       Review of Systems     Objective:   Physical Exam Vitals signs reviewed.  Constitutional:      General: He is not in acute distress.    Appearance: He is obese. He is not ill-appearing, toxic-appearing or diaphoretic.  HENT:     Head: Normocephalic and atraumatic.     Right Ear: Tympanic membrane and ear canal normal. There is no impacted cerumen.     Left Ear: Tympanic membrane and ear canal normal. There is no impacted cerumen.     Nose: Nose normal. No congestion or rhinorrhea.     Mouth/Throat:     Mouth: Mucous membranes are moist.     Pharynx: No oropharyngeal exudate or posterior oropharyngeal erythema.  Eyes:     General: No scleral icterus.       Right eye: No discharge.        Left eye: No discharge.     Extraocular Movements: Extraocular movements intact.     Conjunctiva/sclera: Conjunctivae normal.     Pupils: Pupils are equal, round, and reactive to light.  Neck:     Musculoskeletal: Neck supple.     Vascular: No carotid bruit.  Cardiovascular:     Rate and Rhythm: Normal rate and regular rhythm.     Pulses: Normal pulses.     Heart sounds: Normal heart sounds. No murmur. No friction rub. No gallop.   Pulmonary:     Effort: Pulmonary effort is normal. No respiratory distress.     Breath sounds: Normal breath sounds. No stridor. No wheezing, rhonchi or rales.  Chest:     Chest wall: No tenderness.  Abdominal:     General: Bowel sounds are normal. There is no distension.     Palpations: Abdomen is soft. There is no mass.     Tenderness: There is no abdominal tenderness. There is no guarding or rebound.     Hernia: No hernia is present.  Musculoskeletal:        General: Tenderness present.      Right knee: He exhibits decreased range of motion. Tenderness found. Medial joint line and lateral joint line tenderness  noted.     Right lower leg: No edema.     Left lower leg: No edema.  Skin:    General: Skin is warm.     Coloration: Skin is not jaundiced or pale.     Findings: No bruising, erythema, lesion or rash.  Neurological:     General: No focal deficit present.     Mental Status: He is alert and oriented to person, place, and time. Mental status is at baseline.     Cranial Nerves: No cranial nerve deficit.     Sensory: No sensory deficit.     Motor: No weakness.     Coordination: Coordination normal.     Gait: Gait normal.     Deep Tendon Reflexes: Reflexes normal.  Psychiatric:        Mood and Affect: Mood normal.        Behavior: Behavior normal.        Thought Content: Thought content normal.        Judgment: Judgment normal.           Assessment & Plan:  The primary encounter diagnosis was Encounter for preventive health examination. Diagnoses of Essential hypertension, Atherosclerosis of native coronary artery of native heart without angina pectoris, Prostate cancer screening, and Acute pain of right knee were also pertinent to this visit. Patient's physical exam is significant for obesity.  I recommended trying to achieve a goal weight less than 250 pounds.  I am also concerned about his smoking.  I sent a prescription for Chantix to his pharmacy and also gave the patient a rebate card.  These will be the 2 areas where I would try to work extensively to help reduce the patient's long-term morbidity and mortality risk.  His colonoscopy is up-to-date however I will screen for prostate cancer with a PSA.  His blood pressure today is acceptable.  I will check his cholesterol although his cholesterol last year was outstanding.  Obtain an x-ray of the right knee but I suspect osteoarthritis.  If so I would recommend a cortisone injection versus an orthopedics consultation.

## 2019-04-29 ENCOUNTER — Encounter: Payer: Self-pay | Admitting: Family Medicine

## 2019-04-29 LAB — COMPLETE METABOLIC PANEL WITH GFR
AG Ratio: 1.9 (calc) (ref 1.0–2.5)
ALT: 22 U/L (ref 9–46)
AST: 17 U/L (ref 10–35)
Albumin: 4.3 g/dL (ref 3.6–5.1)
Alkaline phosphatase (APISO): 61 U/L (ref 35–144)
BUN: 14 mg/dL (ref 7–25)
CO2: 26 mmol/L (ref 20–32)
Calcium: 9.1 mg/dL (ref 8.6–10.3)
Chloride: 105 mmol/L (ref 98–110)
Creat: 0.85 mg/dL (ref 0.70–1.33)
GFR, Est African American: 114 mL/min/{1.73_m2} (ref >=60)
GFR, Est Non African American: 99 mL/min/{1.73_m2} (ref >=60)
Globulin: 2.3 g/dL (calc) (ref 1.9–3.7)
Glucose, Bld: 102 mg/dL — ABNORMAL HIGH (ref 65–99)
Potassium: 4.6 mmol/L (ref 3.5–5.3)
Sodium: 137 mmol/L (ref 135–146)
Total Bilirubin: 0.6 mg/dL (ref 0.2–1.2)
Total Protein: 6.6 g/dL (ref 6.1–8.1)

## 2019-04-29 LAB — CBC WITH DIFFERENTIAL/PLATELET
Absolute Monocytes: 381 cells/uL (ref 200–950)
Basophils Absolute: 50 cells/uL (ref 0–200)
Basophils Relative: 0.9 %
Eosinophils Absolute: 190 cells/uL (ref 15–500)
Eosinophils Relative: 3.4 %
HCT: 48.1 % (ref 38.5–50.0)
Hemoglobin: 16.3 g/dL (ref 13.2–17.1)
Lymphs Abs: 1725 cells/uL (ref 850–3900)
MCH: 32 pg (ref 27.0–33.0)
MCHC: 33.9 g/dL (ref 32.0–36.0)
MCV: 94.3 fL (ref 80.0–100.0)
MPV: 9.4 fL (ref 7.5–12.5)
Monocytes Relative: 6.8 %
Neutro Abs: 3254 cells/uL (ref 1500–7800)
Neutrophils Relative %: 58.1 %
Platelets: 204 10*3/uL (ref 140–400)
RBC: 5.1 10*6/uL (ref 4.20–5.80)
RDW: 12.8 % (ref 11.0–15.0)
Total Lymphocyte: 30.8 %
WBC: 5.6 10*3/uL (ref 3.8–10.8)

## 2019-04-29 LAB — LIPID PANEL
Cholesterol: 113 mg/dL (ref <200)
HDL: 35 mg/dL — ABNORMAL LOW (ref >=40)
LDL Cholesterol (Calc): 65 mg/dL (calc)
Non-HDL Cholesterol (Calc): 78 mg/dL (calc) (ref <130)
Total CHOL/HDL Ratio: 3.2 (calc) (ref <5.0)
Triglycerides: 58 mg/dL (ref <150)

## 2019-04-29 LAB — PSA: PSA: 0.4 ng/mL (ref <=4.0)

## 2019-09-15 ENCOUNTER — Telehealth: Payer: Self-pay | Admitting: Family Medicine

## 2019-09-15 DIAGNOSIS — M25561 Pain in right knee: Secondary | ICD-10-CM

## 2019-09-15 NOTE — Telephone Encounter (Signed)
Patient called in stating that at his last visit he discussed his right knee pain. States that he would like to see and Orthopedic specialist at this time. Ok to place referral?

## 2019-09-15 NOTE — Telephone Encounter (Signed)
Referral orders placed

## 2019-09-15 NOTE — Telephone Encounter (Signed)
Ok with ortho.

## 2019-10-16 DIAGNOSIS — M25561 Pain in right knee: Secondary | ICD-10-CM | POA: Insufficient documentation

## 2020-04-30 ENCOUNTER — Ambulatory Visit: Payer: Managed Care, Other (non HMO) | Admitting: Family Medicine

## 2020-04-30 ENCOUNTER — Other Ambulatory Visit: Payer: Self-pay

## 2020-04-30 VITALS — BP 120/90 | HR 86 | Temp 97.8°F | Ht 72.0 in | Wt 295.0 lb

## 2020-04-30 DIAGNOSIS — Z125 Encounter for screening for malignant neoplasm of prostate: Secondary | ICD-10-CM

## 2020-04-30 DIAGNOSIS — I10 Essential (primary) hypertension: Secondary | ICD-10-CM

## 2020-04-30 DIAGNOSIS — Z0001 Encounter for general adult medical examination with abnormal findings: Secondary | ICD-10-CM | POA: Diagnosis not present

## 2020-04-30 DIAGNOSIS — B078 Other viral warts: Secondary | ICD-10-CM | POA: Diagnosis not present

## 2020-04-30 DIAGNOSIS — R5382 Chronic fatigue, unspecified: Secondary | ICD-10-CM | POA: Diagnosis not present

## 2020-04-30 DIAGNOSIS — Z0000 Encounter for general adult medical examination without abnormal findings: Secondary | ICD-10-CM

## 2020-04-30 DIAGNOSIS — R739 Hyperglycemia, unspecified: Secondary | ICD-10-CM

## 2020-04-30 NOTE — Progress Notes (Signed)
Subjective:    Patient ID: George Brock, male    DOB: 1965/10/22, 54 y.o.   MRN: [id number]  HPI Patient is a 54 year old male here today for complete physical exam.  Per his past medical records, he had a positive stress test and ultimately went catheterization was found to have "angiographically normal" coronary arteries.  Last colonoscopy was performed in November 2015 and revealed diverticulosis.  He is not due for repeat colonoscopy until 2025.  He is due for prostate cancer screening.  He continues to smoke.  On physical exam he has expiratory wheezing today.  I continue to encourage him to quit smoking.  He also complains of pain in his left shoulder.  This happened 6 months ago when he was carrying a casket at a funeral.  He has pain with abduction greater than 60 degrees.  He has decreased strength with abduction.  He has pain with internal rotation.  I suspect possibly a labral tear given the fact he has a positive O'Brien sign.  I also am concerned about a possible supraspinatus tear however the patient declines an MRI today.  He also reports fatigue lack of libido and poor energy level.  He has not had his Covid vaccine. Past Medical History:  Diagnosis Date  . Allergy    seasonal  . Anxiety    work related  . Asthma   . Erectile dysfunction   . GERD (gastroesophageal reflux disease)   . Heart murmur   . Tobacco abuse    Past Surgical History:  Procedure Laterality Date  . CARDIOVASCULAR STRESS TEST     cardiolite  . LEFT HEART CATHETERIZATION WITH CORONARY ANGIOGRAM N/A 08/03/2014   Procedure: LEFT HEART CATHETERIZATION WITH CORONARY ANGIOGRAM;  Surgeon: Lennette Bihari, MD;  Location: [facility] CATH LAB;  Service: Cardiovascular;  Laterality: N/A;  . US ECHOCARDIOGRAPHY  06/07/2004   Current Outpatient Medications on File Prior to Visit  Medication Sig Dispense Refill  . albuterol (PROVENTIL HFA;VENTOLIN HFA) 108 (90 Base) MCG/ACT inhaler Inhale 2 puffs into the lungs every 6  (six) hours as needed for wheezing or shortness of breath. 18 g 3  . aspirin EC 81 MG tablet Take 1 tablet (81 mg total) by mouth daily.    . Cetirizine HCl (ZYRTEC ALLERGY PO) Take 10 mg by mouth daily.     . sildenafil (VIAGRA) 100 MG tablet TAKE 1 TABLET BY MOUTH AS DIRECTED -Do NOT take with nitrates or within 4 hours of an alpha BLOCKER 24 tablet 2  . enalapril (VASOTEC) 20 MG tablet Take 1 tablet (20 mg total) by mouth daily. 90 tablet 1   No current facility-administered medications on file prior to visit.   Allergies  Allergen Reactions  . Codeine Other (See Comments)    Stomach upset   Social History   Socioeconomic History  . Marital status: Divorced    Spouse name: Not on file  . Number of children: Not on file  . Years of education: Not on file  . Highest education level: Not on file  Occupational History  . Occupation: [employer], Technical sales engineer  Tobacco Use  . Smoking status: Current Some Day Smoker    Packs/day: 0.50    Types: Cigarettes  . Smokeless tobacco: Never Used  Substance and Sexual Activity  . Alcohol use: Yes    Alcohol/week: 3.0 - 4.0 standard drinks    Types: 2 Glasses of wine, 1 - 2 Standard drinks or equivalent per week  .  Drug use: No  . Sexual activity: Yes    Birth control/protection: None  Other Topics Concern  . Not on file  Social History Narrative  . Not on file   Social Determinants of Health   Financial Resource Strain:   . Difficulty of Paying Living Expenses:   Food Insecurity:   . Worried About Programme researcher, broadcasting/film/video in the Last Year:   . Barista in the Last Year:   Transportation Needs:   . Freight forwarder (Medical):   Marland Kitchen Lack of Transportation (Non-Medical):   Physical Activity:   . Days of Exercise per Week:   . Minutes of Exercise per Session:   Stress:   . Feeling of Stress :   Social Connections:   . Frequency of Communication with Friends and Family:   . Frequency of Social Gatherings with  Friends and Family:   . Attends Religious Services:   . Active Member of Clubs or Organizations:   . Attends Banker Meetings:   Marland Kitchen Marital Status:   Intimate Partner Violence:   . Fear of Current or Ex-Partner:   . Emotionally Abused:   Marland Kitchen Physically Abused:   . Sexually Abused:    Family History  Problem Relation Age of Onset  . CAD Mother   . Diabetes Mother   . Heart disease Mother   . Cancer Father   . Hypertension Father       Review of Systems     Objective:   Physical Exam Vitals reviewed.  Constitutional:      General: He is not in acute distress.    Appearance: He is obese. He is not ill-appearing, toxic-appearing or diaphoretic.  HENT:     Head: Normocephalic and atraumatic.     Right Ear: Tympanic membrane and ear canal normal. There is no impacted cerumen.     Left Ear: Tympanic membrane and ear canal normal. There is no impacted cerumen.     Nose: Nose normal. No congestion or rhinorrhea.     Mouth/Throat:     Mouth: Mucous membranes are moist.     Pharynx: No oropharyngeal exudate or posterior oropharyngeal erythema.  Eyes:     General: No scleral icterus.       Right eye: No discharge.        Left eye: No discharge.     Extraocular Movements: Extraocular movements intact.     Conjunctiva/sclera: Conjunctivae normal.     Pupils: Pupils are equal, round, and reactive to light.  Neck:     Vascular: No carotid bruit.  Cardiovascular:     Rate and Rhythm: Normal rate and regular rhythm.     Pulses: Normal pulses.     Heart sounds: Normal heart sounds. No murmur heard.  No friction rub. No gallop.   Pulmonary:     Effort: Pulmonary effort is normal. No respiratory distress.     Breath sounds: Normal breath sounds. No stridor. No wheezing, rhonchi or rales.  Chest:     Chest wall: No tenderness.  Abdominal:     General: Bowel sounds are normal. There is no distension.     Palpations: Abdomen is soft. There is no mass.     Tenderness:  There is no abdominal tenderness. There is no guarding or rebound.     Hernia: No hernia is present.  Genitourinary:   Musculoskeletal:        General: Tenderness present.     Left shoulder: Tenderness present.  Decreased range of motion. Decreased strength.     Cervical back: Neck supple.     Right knee: Decreased range of motion. Tenderness present over the medial joint line and lateral joint line.     Right lower leg: No edema.     Left lower leg: No edema.  Skin:    General: Skin is warm.     Coloration: Skin is not jaundiced or pale.     Findings: No bruising, erythema, lesion or rash.  Neurological:     General: No focal deficit present.     Mental Status: He is alert and oriented to person, place, and time. Mental status is at baseline.     Cranial Nerves: No cranial nerve deficit.     Sensory: No sensory deficit.     Motor: No weakness.     Coordination: Coordination normal.     Gait: Gait normal.     Deep Tendon Reflexes: Reflexes normal.  Psychiatric:        Mood and Affect: Mood normal.        Behavior: Behavior normal.        Thought Content: Thought content normal.        Judgment: Judgment normal.     Patient has 2 genital warts.  Largest is approximately 1 cm in diameter.  We treated this with liquid nitrogen cryotherapy for a total of 30 seconds.  He can return in 3 to 4 weeks if he wants to try repeat treatment      Assessment & Plan:  Encounter for preventive health examination - Plan: CBC with Differential/Platelet, COMPLETE METABOLIC PANEL WITH GFR, Lipid panel  Essential hypertension - Plan: CBC with Differential/Platelet, COMPLETE METABOLIC PANEL WITH GFR, Lipid panel  Prostate cancer screening - Plan: PSA  Chronic fatigue - Plan: Testosterone Total,Free,Bio, Males  Elevated blood sugar - Plan: Hemoglobin A1c  #1 encouraged the Covid vaccine.  #2 encourage smoking cessation.  Check CBC, CMP, fasting lipid panel.  Goal LDL cholesterol is less than  100.  Blood pressure today is acceptable.  Screen for prostate cancer with a PSA.  He has had elevated blood sugars at home and therefore I will check an A1c to evaluate for diabetes particular given his family history of type 2 diabetes.  Given his chronic fatigue I will check a testosterone level.  A genital wart on the dorsal shaft of his penis was treated with liquid nitrogen cryotherapy for a total of 30 seconds.

## 2020-05-03 ENCOUNTER — Telehealth: Payer: Self-pay

## 2020-05-03 LAB — COMPLETE METABOLIC PANEL WITH GFR
AG Ratio: 1.7 (calc) (ref 1.0–2.5)
ALT: 21 U/L (ref 9–46)
AST: 17 U/L (ref 10–35)
Albumin: 4.2 g/dL (ref 3.6–5.1)
Alkaline phosphatase (APISO): 72 U/L (ref 35–144)
BUN: 14 mg/dL (ref 7–25)
CO2: 26 mmol/L (ref 20–32)
Calcium: 9 mg/dL (ref 8.6–10.3)
Chloride: 104 mmol/L (ref 98–110)
Creat: 0.98 mg/dL (ref 0.70–1.33)
GFR, Est African American: 100 mL/min/{1.73_m2} (ref >=60)
GFR, Est Non African American: 86 mL/min/{1.73_m2} (ref >=60)
Globulin: 2.5 g/dL (calc) (ref 1.9–3.7)
Glucose, Bld: 107 mg/dL — ABNORMAL HIGH (ref 65–99)
Potassium: 4.7 mmol/L (ref 3.5–5.3)
Sodium: 136 mmol/L (ref 135–146)
Total Bilirubin: 0.5 mg/dL (ref 0.2–1.2)
Total Protein: 6.7 g/dL (ref 6.1–8.1)

## 2020-05-03 LAB — LIPID PANEL
Cholesterol: 122 mg/dL (ref <200)
HDL: 36 mg/dL — ABNORMAL LOW (ref >=40)
LDL Cholesterol (Calc): 72 mg/dL (calc)
Non-HDL Cholesterol (Calc): 86 mg/dL (calc) (ref <130)
Total CHOL/HDL Ratio: 3.4 (calc) (ref <5.0)
Triglycerides: 60 mg/dL (ref <150)

## 2020-05-03 LAB — HEMOGLOBIN A1C
Hgb A1c MFr Bld: 5.4 % of total Hgb (ref <5.7)
Mean Plasma Glucose: 108 (calc)
eAG (mmol/L): 6 (calc)

## 2020-05-03 LAB — CBC WITH DIFFERENTIAL/PLATELET
Absolute Monocytes: 490 cells/uL (ref 200–950)
Basophils Absolute: 50 cells/uL (ref 0–200)
Basophils Relative: 0.7 %
Eosinophils Absolute: 209 cells/uL (ref 15–500)
Eosinophils Relative: 2.9 %
HCT: 48.6 % (ref 38.5–50.0)
Hemoglobin: 16.7 g/dL (ref 13.2–17.1)
Lymphs Abs: 1699 cells/uL (ref 850–3900)
MCH: 33 pg (ref 27.0–33.0)
MCHC: 34.4 g/dL (ref 32.0–36.0)
MCV: 96 fL (ref 80.0–100.0)
MPV: 9.4 fL (ref 7.5–12.5)
Monocytes Relative: 6.8 %
Neutro Abs: 4752 cells/uL (ref 1500–7800)
Neutrophils Relative %: 66 %
Platelets: 226 10*3/uL (ref 140–400)
RBC: 5.06 10*6/uL (ref 4.20–5.80)
RDW: 12.5 % (ref 11.0–15.0)
Total Lymphocyte: 23.6 %
WBC: 7.2 10*3/uL (ref 3.8–10.8)

## 2020-05-03 LAB — TESTOSTERONE TOTAL,FREE,BIO, MALES
Albumin: 4.2 g/dL (ref 3.6–5.1)
Sex Hormone Binding: 41 nmol/L (ref 10–50)
Testosterone, Bioavailable: 74.9 ng/dL — ABNORMAL LOW (ref >=110.0)
Testosterone, Free: 38.9 pg/mL — ABNORMAL LOW (ref 46.0–224.0)
Testosterone: 356 ng/dL (ref 250–827)

## 2020-05-03 LAB — PSA: PSA: 0.3 ng/mL (ref <=4.0)

## 2020-05-03 NOTE — Telephone Encounter (Signed)
George Brock was asking for topical solution that was supposed to been called in?

## 2020-05-04 NOTE — Telephone Encounter (Signed)
And make Pt aware provider will freeze again in 4 weeks

## 2020-05-04 NOTE — Telephone Encounter (Signed)
Will call Pt and let him know this is what the wart was froze for.

## 2020-05-04 NOTE — Telephone Encounter (Signed)
For what?  We froze the wart. I would freeze again in 4 weeks

## 2020-05-21 ENCOUNTER — Other Ambulatory Visit: Payer: Self-pay

## 2020-05-21 ENCOUNTER — Ambulatory Visit: Payer: Managed Care, Other (non HMO) | Admitting: Family Medicine

## 2020-05-21 VITALS — BP 132/84 | HR 90 | Temp 96.3°F | Ht 72.0 in | Wt 302.0 lb

## 2020-05-21 DIAGNOSIS — E291 Testicular hypofunction: Secondary | ICD-10-CM

## 2020-05-21 DIAGNOSIS — M25561 Pain in right knee: Secondary | ICD-10-CM

## 2020-05-21 MED ORDER — IMIQUIMOD 5 % EX CREA: TOPICAL_CREAM | CUTANEOUS | 3 refills | Status: DC

## 2020-05-21 NOTE — Progress Notes (Signed)
Subjective:    Patient ID: George Brock, male    DOB: Mar 24, 1966, 54 y.o.   MRN: [id number]  HPI At the patient's last visit, he was diagnosed with hypogonadism.  He reports fatigue, poor energy, and low libido.  His testosterone levels were found to be extremely low.  He is here today to discuss treatment options.  He is also requesting a cortisone injection in his right knee.  He complains of pain with ambulation.  He complains of pain climbing into and out of his truck at work. Past Medical History:  Diagnosis Date  . Allergy    seasonal  . Anxiety    work related  . Asthma   . Erectile dysfunction   . GERD (gastroesophageal reflux disease)   . Heart murmur   . Tobacco abuse    Past Surgical History:  Procedure Laterality Date  . CARDIOVASCULAR STRESS TEST     cardiolite  . LEFT HEART CATHETERIZATION WITH CORONARY ANGIOGRAM N/A 08/03/2014   Procedure: LEFT HEART CATHETERIZATION WITH CORONARY ANGIOGRAM;  Surgeon: Lennette Bihari, MD;  Location: [facility] CATH LAB;  Service: Cardiovascular;  Laterality: N/A;  . US ECHOCARDIOGRAPHY  06/07/2004   Current Outpatient Medications on File Prior to Visit  Medication Sig Dispense Refill  . albuterol (PROVENTIL HFA;VENTOLIN HFA) 108 (90 Base) MCG/ACT inhaler Inhale 2 puffs into the lungs every 6 (six) hours as needed for wheezing or shortness of breath. 18 g 3  . aspirin EC 81 MG tablet Take 1 tablet (81 mg total) by mouth daily.    . Cetirizine HCl (ZYRTEC ALLERGY PO) Take 10 mg by mouth daily.     . enalapril (VASOTEC) 20 MG tablet Take 1 tablet (20 mg total) by mouth daily. 90 tablet 1  . sildenafil (VIAGRA) 100 MG tablet TAKE 1 TABLET BY MOUTH AS DIRECTED -Do NOT take with nitrates or within 4 hours of an alpha BLOCKER 24 tablet 2   No current facility-administered medications on file prior to visit.   Allergies  Allergen Reactions  . Codeine Other (See Comments)    Stomach upset   Social History   Socioeconomic History  .  Marital status: Divorced    Spouse name: Not on file  . Number of children: Not on file  . Years of education: Not on file  . Highest education level: Not on file  Occupational History  . Occupation: [employer], Technical sales engineer  Tobacco Use  . Smoking status: Current Some Day Smoker    Packs/day: 0.50    Types: Cigarettes  . Smokeless tobacco: Never Used  Substance and Sexual Activity  . Alcohol use: Yes    Alcohol/week: 3.0 - 4.0 standard drinks    Types: 2 Glasses of wine, 1 - 2 Standard drinks or equivalent per week  . Drug use: No  . Sexual activity: Yes    Birth control/protection: None  Other Topics Concern  . Not on file  Social History Narrative  . Not on file   Social Determinants of Health   Financial Resource Strain:   . Difficulty of Paying Living Expenses: Not on file  Food Insecurity:   . Worried About Programme researcher, broadcasting/film/video in the Last Year: Not on file  . Ran Out of Food in the Last Year: Not on file  Transportation Needs:   . Lack of Transportation (Medical): Not on file  . Lack of Transportation (Non-Medical): Not on file  Physical Activity:   . Days of Exercise per  Week: Not on file  . Minutes of Exercise per Session: Not on file  Stress:   . Feeling of Stress : Not on file  Social Connections:   . Frequency of Communication with Friends and Family: Not on file  . Frequency of Social Gatherings with Friends and Family: Not on file  . Attends Religious Services: Not on file  . Active Member of Clubs or Organizations: Not on file  . Attends Banker Meetings: Not on file  . Marital Status: Not on file  Intimate Partner Violence:   . Fear of Current or Ex-Partner: Not on file  . Emotionally Abused: Not on file  . Physically Abused: Not on file  . Sexually Abused: Not on file   Family History  Problem Relation Age of Onset  . CAD Mother   . Diabetes Mother   . Heart disease Mother   . Cancer Father   . Hypertension Father        Review of Systems     Objective:   Physical Exam Vitals reviewed.  Constitutional:      General: He is not in acute distress.    Appearance: He is obese. He is not ill-appearing, toxic-appearing or diaphoretic.  HENT:     Head: Normocephalic and atraumatic.     Right Ear: Tympanic membrane and ear canal normal. There is no impacted cerumen.     Left Ear: Tympanic membrane and ear canal normal. There is no impacted cerumen.     Nose: Nose normal. No congestion or rhinorrhea.     Mouth/Throat:     Mouth: Mucous membranes are moist.     Pharynx: No oropharyngeal exudate or posterior oropharyngeal erythema.  Eyes:     General: No scleral icterus.       Right eye: No discharge.        Left eye: No discharge.     Extraocular Movements: Extraocular movements intact.     Conjunctiva/sclera: Conjunctivae normal.     Pupils: Pupils are equal, round, and reactive to light.  Neck:     Vascular: No carotid bruit.  Cardiovascular:     Rate and Rhythm: Normal rate and regular rhythm.     Pulses: Normal pulses.     Heart sounds: Normal heart sounds. No murmur heard.  No friction rub. No gallop.   Pulmonary:     Effort: Pulmonary effort is normal. No respiratory distress.     Breath sounds: Normal breath sounds. No stridor. No wheezing, rhonchi or rales.  Chest:     Chest wall: No tenderness.  Abdominal:     General: Bowel sounds are normal. There is no distension.     Palpations: Abdomen is soft. There is no mass.     Tenderness: There is no abdominal tenderness. There is no guarding or rebound.     Hernia: No hernia is present.  Genitourinary:   Musculoskeletal:        General: Tenderness present.     Cervical back: Neck supple.     Right knee: Decreased range of motion. Tenderness present over the medial joint line and lateral joint line.     Right lower leg: No edema.     Left lower leg: No edema.  Skin:    General: Skin is warm.     Coloration: Skin is not jaundiced  or pale.     Findings: No bruising, erythema, lesion or rash.  Neurological:     General: No focal deficit present.  Mental Status: He is alert and oriented to person, place, and time. Mental status is at baseline.     Cranial Nerves: No cranial nerve deficit.     Sensory: No sensory deficit.     Motor: No weakness.     Coordination: Coordination normal.     Gait: Gait normal.     Deep Tendon Reflexes: Reflexes normal.  Psychiatric:        Mood and Affect: Mood normal.        Behavior: Behavior normal.        Thought Content: Thought content normal.        Judgment: Judgment normal.         Assessment & Plan:  Hypogonadism in male - Plan: Testosterone, Prolactin, Follicle stimulating hormone, Luteinizing hormone  Acute pain of right knee  Spent more than 20 minutes today with the patient discussing the risk and benefits of testosterone replacement.  Check a prolactin level to rule out prolactinoma.  Recheck testosterone level along with FSH and LH.  If testosterone level is confirmed again to be low I would recommend testosterone cypionate 200 mg IM every 2 weeks.  Using sterile technique, I injected his right knee with 2 cc lidocaine, 2 cc of Marcaine, and 2 cc of 40 mg/mL Kenalog.  The patient tolerated the procedure well without complication.  I believe his knee pain is likely secondary to osteoarthritis.  Patient would like to apply a topical solution to the wart on his penis.  We will try aldara.  He will apply a thin film at bedtime and rinse off after 8 hours.  He will do this 3 times a week.  May need to repeat for up to 16 weeks.

## 2020-05-22 LAB — FOLLICLE STIMULATING HORMONE: FSH: 4.6 m[IU]/mL (ref 1.6–8.0)

## 2020-05-22 LAB — PROLACTIN: Prolactin: 3.7 ng/mL (ref 2.0–18.0)

## 2020-05-22 LAB — LUTEINIZING HORMONE: LH: 2.2 m[IU]/mL (ref 1.5–9.3)

## 2020-05-22 LAB — TESTOSTERONE: Testosterone: 281 ng/dL (ref 250–827)

## 2020-05-25 ENCOUNTER — Other Ambulatory Visit: Payer: Self-pay

## 2020-05-26 ENCOUNTER — Other Ambulatory Visit: Payer: Self-pay

## 2020-05-26 DIAGNOSIS — E559 Vitamin D deficiency, unspecified: Secondary | ICD-10-CM

## 2020-05-26 DIAGNOSIS — E291 Testicular hypofunction: Secondary | ICD-10-CM

## 2020-05-26 MED ORDER — "SYRINGE/NEEDLE (DISP) 25G X 1"" 3 ML MISC": 3.0000 mL | Freq: Every morning | 4 refills | Status: DC

## 2020-05-28 ENCOUNTER — Other Ambulatory Visit: Payer: Self-pay

## 2020-05-28 DIAGNOSIS — I10 Essential (primary) hypertension: Secondary | ICD-10-CM

## 2020-06-14 ENCOUNTER — Other Ambulatory Visit: Payer: Self-pay

## 2020-06-14 DIAGNOSIS — I10 Essential (primary) hypertension: Secondary | ICD-10-CM

## 2020-06-14 MED ORDER — ENALAPRIL MALEATE 20 MG PO TABS: 20.0000 mg | ORAL_TABLET | Freq: Every day | ORAL | 1 refills | Status: DC

## 2020-06-14 MED ORDER — SILDENAFIL CITRATE 100 MG PO TABS
ORAL_TABLET | ORAL | 2 refills | Status: DC
Start: 2020-06-14 — End: 2023-11-19

## 2020-06-14 MED ORDER — TESTOSTERONE CYPIONATE 200 MG/ML IM SOLN: 200.0000 mg | INTRAMUSCULAR | 0 refills | Status: DC

## 2020-09-20 ENCOUNTER — Other Ambulatory Visit: Payer: Self-pay

## 2020-09-20 ENCOUNTER — Encounter: Payer: Self-pay | Admitting: Family Medicine

## 2020-09-20 ENCOUNTER — Ambulatory Visit (INDEPENDENT_AMBULATORY_CARE_PROVIDER_SITE_OTHER): Payer: Managed Care, Other (non HMO) | Admitting: Family Medicine

## 2020-09-20 VITALS — BP 148/84 | HR 100 | Temp 99.0°F | Resp 14 | Ht 72.0 in | Wt 300.0 lb

## 2020-09-20 DIAGNOSIS — J019 Acute sinusitis, unspecified: Secondary | ICD-10-CM | POA: Diagnosis not present

## 2020-09-20 MED ORDER — AMOXICILLIN 875 MG PO TABS: 875.0000 mg | ORAL_TABLET | Freq: Two times a day (BID) | ORAL | 0 refills | Status: DC

## 2020-09-20 NOTE — Progress Notes (Signed)
Subjective:    Patient ID: George Brock, male    DOB: 1966/09/07, 55 y.o.   MRN: [id number]  HPI Symptoms began 5 days ago.  Symptoms include pain and pressure in his frontal sinuses bilaterally.  He also has pain and pressure in his maxillary sinuses.  He has copious rhinorrhea and head congestion.  He has a dull headache and subjective fevers although he has not recorded an actual temperature greater than 100.  He also reports body aches.  He works on the [employer] team at work and therefore he has been exposed.  He has had both doses of his Covid vaccine.  His wife was recently tested for Covid and tested negative.  He denies any shortness of breath or chest pain Past Medical History:  Diagnosis Date  . Allergy    seasonal  . Anxiety    work related  . Asthma   . Erectile dysfunction   . GERD (gastroesophageal reflux disease)   . Heart murmur   . Tobacco abuse    Past Surgical History:  Procedure Laterality Date  . CARDIOVASCULAR STRESS TEST     cardiolite  . LEFT HEART CATHETERIZATION WITH CORONARY ANGIOGRAM N/A 08/03/2014   Procedure: LEFT HEART CATHETERIZATION WITH CORONARY ANGIOGRAM;  Surgeon: Lennette Bihari, MD;  Location: [facility] CATH LAB;  Service: Cardiovascular;  Laterality: N/A;  . US ECHOCARDIOGRAPHY  06/07/2004   Current Outpatient Medications on File Prior to Visit  Medication Sig Dispense Refill  . albuterol (PROVENTIL HFA;VENTOLIN HFA) 108 (90 Base) MCG/ACT inhaler Inhale 2 puffs into the lungs every 6 (six) hours as needed for wheezing or shortness of breath. 18 g 3  . aspirin EC 81 MG tablet Take 1 tablet (81 mg total) by mouth daily.    . enalapril (VASOTEC) 20 MG tablet Take 1 tablet (20 mg total) by mouth daily. 90 tablet 1  . sildenafil (VIAGRA) 100 MG tablet TAKE 1 TABLET BY MOUTH AS DIRECTED -Do NOT take with nitrates or within 4 hours of an alpha BLOCKER 24 tablet 2  . SYRINGE-NEEDLE, DISP, 3 ML 25G X 1" 3 ML MISC 3 mLs by Does not apply route every  morning. 5 each 4  . testosterone cypionate (DEPOTESTOSTERONE CYPIONATE) 200 MG/ML injection Inject 1 mL (200 mg total) into the muscle every 14 (fourteen) days. 10 mL 0  . Cetirizine HCl (ZYRTEC ALLERGY PO) Take 10 mg by mouth daily.  (Patient not taking: Reported on 09/20/2020)     No current facility-administered medications on file prior to visit.   Allergies  Allergen Reactions  . Codeine Other (See Comments)    Stomach upset   Social History   Socioeconomic History  . Marital status: Divorced    Spouse name: Not on file  . Number of children: Not on file  . Years of education: Not on file  . Highest education level: Not on file  Occupational History  . Occupation: [employer], Technical sales engineer  Tobacco Use  . Smoking status: Current Some Day Smoker    Packs/day: 0.50    Types: Cigarettes  . Smokeless tobacco: Never Used  Substance and Sexual Activity  . Alcohol use: Yes    Alcohol/week: 3.0 - 4.0 standard drinks    Types: 2 Glasses of wine, 1 - 2 Standard drinks or equivalent per week  . Drug use: No  . Sexual activity: Yes    Birth control/protection: None  Other Topics Concern  . Not on file  Social History  Narrative  . Not on file   Social Determinants of Health   Financial Resource Strain: Not on file  Food Insecurity: Not on file  Transportation Needs: Not on file  Physical Activity: Not on file  Stress: Not on file  Social Connections: Not on file  Intimate Partner Violence: Not on file      Review of Systems  All other systems reviewed and are negative.      Objective:   Physical Exam Vitals reviewed.  Constitutional:      General: He is not in acute distress.    Appearance: Normal appearance. He is obese. He is ill-appearing. He is not toxic-appearing.  HENT:     Head: Normocephalic and atraumatic.     Right Ear: Tympanic membrane, ear canal and external ear normal.     Left Ear: Tympanic membrane, ear canal and external ear normal.      Nose: Congestion and rhinorrhea present.     Left Sinus: Maxillary sinus tenderness and frontal sinus tenderness present.     Mouth/Throat:     Mouth: Mucous membranes are moist.     Pharynx: No oropharyngeal exudate or posterior oropharyngeal erythema.  Eyes:     General:        Right eye: No discharge.        Left eye: No discharge.     Conjunctiva/sclera: Conjunctivae normal.  Cardiovascular:     Rate and Rhythm: Normal rate and regular rhythm.     Pulses: Normal pulses.     Heart sounds: Normal heart sounds. No murmur heard. No friction rub. No gallop.   Pulmonary:     Effort: Pulmonary effort is normal.     Breath sounds: Decreased air movement present. Examination of the right-upper field reveals wheezing. Examination of the left-upper field reveals wheezing. Examination of the right-middle field reveals wheezing. Examination of the left-middle field reveals wheezing. Examination of the right-lower field reveals wheezing. Examination of the left-lower field reveals wheezing. Wheezing present. No rhonchi or rales.  Abdominal:     General: Bowel sounds are normal.     Palpations: Abdomen is soft.     Tenderness: There is no abdominal tenderness. There is no guarding or rebound.  Musculoskeletal:     Cervical back: Normal range of motion and neck supple.  Lymphadenopathy:     Cervical: No cervical adenopathy.  Skin:    Findings: No rash.  Neurological:     Mental Status: He is alert.           Assessment & Plan:  Acute rhinosinusitis - Plan: SARS-COV-2 RNA,(COVID-19) QUAL NAAT  Symptoms are consistent with a flulike illness evolving into a sinus infection.  Treat the sinus infection with amoxicillin 875 mg p.o. twice daily for 10 days.  However I will also screen the patient for Covid as he has a high likelihood of being exposed and symptoms could also be consistent with COVID-19.  Recommended that he quarantine at home until test returns negative or he completes a minimum  5-day quarantine based on current CDC guidelines

## 2020-09-21 ENCOUNTER — Telehealth: Payer: Self-pay | Admitting: Family Medicine

## 2020-09-21 NOTE — Telephone Encounter (Signed)
Patient left vm asking for a letter for his employer letting them know when he can return to work. He states he was tested for Covid yesterday.  CB# [phone]

## 2020-09-21 NOTE — Telephone Encounter (Signed)
Letter transcribed.

## 2020-09-22 LAB — SARS-COV-2 RNA,(COVID-19) QUALITATIVE NAAT: SARS CoV2 RNA: DETECTED — AB

## 2020-09-27 ENCOUNTER — Telehealth: Payer: Self-pay

## 2020-09-27 NOTE — Telephone Encounter (Signed)
Patient called for covid results emailed to employer

## 2021-11-14 ENCOUNTER — Encounter: Payer: Self-pay | Admitting: Family Medicine

## 2021-11-14 ENCOUNTER — Other Ambulatory Visit: Payer: Self-pay

## 2021-11-14 ENCOUNTER — Ambulatory Visit: Payer: Managed Care, Other (non HMO) | Admitting: Family Medicine

## 2021-11-14 VITALS — BP 138/88 | HR 84 | Temp 97.0°F | Resp 18 | Ht 72.0 in | Wt 322.0 lb

## 2021-11-14 DIAGNOSIS — L0390 Cellulitis, unspecified: Secondary | ICD-10-CM

## 2021-11-14 DIAGNOSIS — T63304A Toxic effect of unspecified spider venom, undetermined, initial encounter: Secondary | ICD-10-CM

## 2021-11-14 MED ORDER — DOXYCYCLINE HYCLATE 100 MG PO TABS: 100.0000 mg | ORAL_TABLET | Freq: Two times a day (BID) | ORAL | 0 refills | Status: DC

## 2021-11-14 NOTE — Progress Notes (Signed)
Subjective:    Patient ID: George Brock, male    DOB: 09-Apr-1966, 56 y.o.   MRN: [id number]  HPI Patient was bitten by a spider approximately 2 weeks ago.  The area was located just inferior and posterior to the right nipple near the mid axillary line.  On the day after, he sought to [facility] in the center.  There was central pallor.  The lesion will not heal now.  There is a small ulcer that is approximately 6 mm in diameter.  The surrounding skin is erythematous warm and tender.  This is approximately 3 cm x 2 cm in diameter.  There is no drainage or fluctuance.  Tetanus shot is less than 1 year ago. Past Medical History:  Diagnosis Date   Allergy    seasonal   Anxiety    work related   Asthma    Erectile dysfunction    GERD (gastroesophageal reflux disease)    Heart murmur    Tobacco abuse    Past Surgical History:  Procedure Laterality Date   CARDIOVASCULAR STRESS TEST     cardiolite   LEFT HEART CATHETERIZATION WITH CORONARY ANGIOGRAM N/A 08/03/2014   Procedure: LEFT HEART CATHETERIZATION WITH CORONARY ANGIOGRAM;  Surgeon: Lennette Bihari, MD;  Location: [facility] CATH LAB;  Service: Cardiovascular;  Laterality: N/A;   US ECHOCARDIOGRAPHY  06/07/2004   Current Outpatient Medications on File Prior to Visit  Medication Sig Dispense Refill   albuterol (PROVENTIL HFA;VENTOLIN HFA) 108 (90 Base) MCG/ACT inhaler Inhale 2 puffs into the lungs every 6 (six) hours as needed for wheezing or shortness of breath. 18 g 3   aspirin EC 81 MG tablet Take 1 tablet (81 mg total) by mouth daily.     Cetirizine HCl (ZYRTEC ALLERGY PO) Take 10 mg by mouth daily.  (Patient not taking: Reported on 09/20/2020)     enalapril (VASOTEC) 20 MG tablet Take 1 tablet (20 mg total) by mouth daily. 90 tablet 1   sildenafil (VIAGRA) 100 MG tablet TAKE 1 TABLET BY MOUTH AS DIRECTED -Do NOT take with nitrates or within 4 hours of an alpha BLOCKER (Patient not taking: Reported on 11/14/2021) 24 tablet 2    SYRINGE-NEEDLE, DISP, 3 ML 25G X 1" 3 ML MISC 3 mLs by Does not apply route every morning. (Patient not taking: Reported on 11/14/2021) 5 each 4   testosterone cypionate (DEPOTESTOSTERONE CYPIONATE) 200 MG/ML injection Inject 1 mL (200 mg total) into the muscle every 14 (fourteen) days. (Patient not taking: Reported on 11/14/2021) 10 mL 0   No current facility-administered medications on file prior to visit.   Allergies  Allergen Reactions   Codeine Other (See Comments)    Stomach upset   Social History   Socioeconomic History   Marital status: Divorced    Spouse name: Not on file   Number of children: Not on file   Years of education: Not on file   Highest education level: Not on file  Occupational History   Occupation: [employer], Technical sales engineer  Tobacco Use   Smoking status: Some Days    Packs/day: 0.50    Types: Cigarettes   Smokeless tobacco: Never  Substance and Sexual Activity   Alcohol use: Yes    Alcohol/week: 3.0 - 4.0 standard drinks    Types: 2 Glasses of wine, 1 - 2 Standard drinks or equivalent per week   Drug use: No   Sexual activity: Yes    Birth control/protection: None  Other Topics  Concern   Not on file  Social History Narrative   Not on file   Social Determinants of Health   Financial Resource Strain: Not on file  Food Insecurity: Not on file  Transportation Needs: Not on file  Physical Activity: Not on file  Stress: Not on file  Social Connections: Not on file  Intimate Partner Violence: Not on file   Family History  Problem Relation Age of Onset   CAD Mother    Diabetes Mother    Heart disease Mother    Cancer Father    Hypertension Father       Review of Systems     Objective:   Physical Exam Vitals reviewed.  Constitutional:      General: He is not in acute distress.    Appearance: He is obese. He is not ill-appearing, toxic-appearing or diaphoretic.  HENT:     Head: Normocephalic and atraumatic.  Cardiovascular:      Rate and Rhythm: Normal rate and regular rhythm.     Pulses: Normal pulses.     Heart sounds: Normal heart sounds. No murmur heard.   No friction rub. No gallop.  Pulmonary:     Effort: Pulmonary effort is normal.     Breath sounds: Normal breath sounds.  Chest:    Skin:    General: Skin is warm.     Findings: Erythema and lesion present.  Neurological:     Mental Status: He is alert.        Assessment & Plan:  Spider bite wound, undetermined intent, initial encounter  Cellulitis, unspecified cellulitis site' believe the patient likely had a brown recluse spider bite with skin breakdown/ulceration that was secondarily infected.  I will treat the cellulitis with doxycycline 100 mg p.o. twice daily for 7 days.  Tetanus shot is up-to-date.  Reassess in 7 to 10 days if not healing or sooner if worsening

## 2021-11-21 ENCOUNTER — Telehealth: Payer: Self-pay | Admitting: Family Medicine

## 2021-11-21 ENCOUNTER — Telehealth: Payer: Managed Care, Other (non HMO) | Admitting: Physician Assistant

## 2021-11-21 DIAGNOSIS — U071 COVID-19: Secondary | ICD-10-CM | POA: Diagnosis not present

## 2021-11-21 MED ORDER — MOLNUPIRAVIR EUA 200MG CAPSULE: 4.0000 | ORAL_CAPSULE | Freq: Two times a day (BID) | ORAL | 0 refills | Status: AC

## 2021-11-21 NOTE — Progress Notes (Signed)
?Virtual Visit Consent  ? ?George Brock, you are scheduled for a virtual visit with a [name] provider today.   ?  ?Just as with appointments in the office, your consent must be obtained to participate.  Your consent will be active for this visit and any virtual visit you may have with one of our providers in the next 365 days.   ?  ?If you have a MyChart account, a copy of this consent can be sent to you electronically.  All virtual visits are billed to your insurance company just like a traditional visit in the office.   ? ?As this is a virtual visit, video technology does not allow for your provider to perform a traditional examination.  This may limit your provider's ability to fully assess your condition.  If your provider identifies any concerns that need to be evaluated in person or the need to arrange testing (such as labs, EKG, etc.), we will make arrangements to do so.   ?  ?Although advances in technology are sophisticated, we cannot ensure that it will always work on either your end or our end.  If the connection with a video visit is poor, the visit may have to be switched to a telephone visit.  With either a video or telephone visit, we are not always able to ensure that we have a secure connection.    ? ?I need to obtain your verbal consent now.   Are you willing to proceed with your visit today?  ?  ?[name] has provided verbal consent on 11/21/2021 for a virtual visit (video or telephone). ?  ?Mar Daring, PA-C  ? ?Date: 11/21/2021 12:46 PM ? ? ?Virtual Visit via Video Note  ? ?George Brock, connected with  George Brock  ([id number], Jun 30, 1966) on 11/21/21 at 12:30 PM EST by a video-enabled telemedicine application and verified that I am speaking with the correct person using two identifiers. ? ?Location: ?Patient: Virtual Visit Location Patient: Home ?Provider: Virtual Visit Location Provider: Home Office ?  ?I discussed the limitations of evaluation and  management by telemedicine and the availability of in person appointments. The patient expressed understanding and agreed to proceed.   ? ?History of Present Illness: ?George Brock is a 56 y.o. who identifies as a male who was assigned male at birth, and is being seen today for Covid 31. ? ?HPI: URI  ?This is a new problem. The current episode started today (has been caring for his wife and father-in-law who have Covid 33). The problem has been gradually worsening. The maximum temperature recorded prior to his arrival was 100.4 - 100.9 F. The fever has been present for Less than 1 day. Associated symptoms include congestion, coughing, headaches, rhinorrhea, sinus pain and a sore throat. Pertinent negatives include no diarrhea, ear pain, nausea, plugged ear sensation or vomiting. Associated symptoms comments: Chills, body aches, lack of taste.   ? ? ?Problems:  ?Patient Active Problem List  ? Diagnosis Date Noted  ? Pain in right knee 10/16/2019  ? Vitamin D deficiency 11/27/2016  ? Essential hypertension 11/20/2016  ? Fatigue 03/26/2016  ? Morbid obesity ([place]) 03/26/2016  ? Asymptomatic PVCs 03/26/2016  ? Coronary atherosclerosis 03/26/2016  ? Allergic rhinitis 06/07/2015  ? 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 Mild intermittent asthma in adult without complication 0000000  ? GERD (gastroesophageal reflux disease) 06/01/2015  ? Allergy   ? Anxiety   ?  Heart murmur   ? Abnormal myocardial perfusion study -"false positive" 07/27/2014  ? Obesity (BMI 35.0-39.9 without comorbidity) 07/06/2014  ? Tobacco abuse 07/06/2014  ? Gastroesophageal reflux disease with esophagitis 05/11/2014  ?  ?Allergies:  ?Allergies  ?Allergen Reactions  ? Codeine Other (See Comments)  ?  Stomach upset  ? ?Medications:  ?Current Outpatient Medications:  ?  molnupiravir EUA (LAGEVRIO) 200 mg CAPS capsule, Take 4 capsules (800 mg total) by mouth 2 (two) times daily for 5 days., Disp: 40 capsule, Rfl:  0 ?  albuterol (PROVENTIL HFA;VENTOLIN HFA) 108 (90 Base) MCG/ACT inhaler, Inhale 2 puffs into the lungs every 6 (six) hours as needed for wheezing or shortness of breath., Disp: 18 g, Rfl: 3 ?  aspirin EC 81 MG tablet, Take 1 tablet (81 mg total) by mouth daily., Disp: , Rfl:  ?  Cetirizine HCl (ZYRTEC ALLERGY PO), Take 10 mg by mouth daily.  (Patient not taking: Reported on 09/20/2020), Disp: , Rfl:  ?  doxycycline (VIBRA-TABS) 100 MG tablet, Take 1 tablet (100 mg total) by mouth 2 (two) times daily., Disp: 14 tablet, Rfl: 0 ?  enalapril (VASOTEC) 20 MG tablet, Take 1 tablet (20 mg total) by mouth daily., Disp: 90 tablet, Rfl: 1 ?  sildenafil (VIAGRA) 100 MG tablet, TAKE 1 TABLET BY MOUTH AS DIRECTED -Do NOT take with nitrates or within 4 hours of an alpha BLOCKER (Patient not taking: Reported on 11/14/2021), Disp: 24 tablet, Rfl: 2 ?  SYRINGE-NEEDLE, DISP, 3 ML 25G X 1" 3 ML MISC, 3 mLs by Does not apply route every morning. (Patient not taking: Reported on 11/14/2021), Disp: 5 each, Rfl: 4 ?  testosterone cypionate (DEPOTESTOSTERONE CYPIONATE) 200 MG/ML injection, Inject 1 mL (200 mg total) into the muscle every 14 (fourteen) days. (Patient not taking: Reported on 11/14/2021), Disp: 10 mL, Rfl: 0 ? ?Observations/Objective: ?Patient is well-developed, well-nourished in no acute distress.  ?Resting comfortably at home.  ?Head is normocephalic, atraumatic.  ?No labored breathing.  ?Speech is clear and coherent with logical content.  ?Patient is alert and oriented at baseline.  ? ? ?Assessment and Plan: ?1. COVID-19 ?- molnupiravir EUA (LAGEVRIO) 200 mg CAPS capsule; Take 4 capsules (800 mg total) by mouth 2 (two) times daily for 5 days.  Dispense: 40 capsule; Refill: 0 ? ?- Continue OTC symptomatic management of choice ?- Will send OTC vitamins and supplement information through AVS ?- Molnupiravir prescribed ?- Patient enrolled in MyChart symptom monitoring ?- Push fluids ?- Rest as needed ?- Discussed return  precautions and when to seek in-person evaluation, sent via AVS as well ? ? ?Follow Up Instructions: ?I discussed the assessment and treatment plan with the patient. The patient was provided an opportunity to ask questions and all were answered. The patient agreed with the plan and demonstrated an understanding of the instructions.  A copy of instructions were sent to the patient via MyChart unless otherwise noted below.  ? ? ?The patient was advised to call back or seek an in-person evaluation if the symptoms worsen or if the condition fails to improve as anticipated. ? ?Time:  ?I spent 14 minutes with the patient via telehealth technology discussing the above problems/concerns.   ? ?Mar Daring, PA-C ?

## 2021-11-21 NOTE — Patient Instructions (Signed)
George HewEdward G Brock, thank you for joining George LovelessJennifer M Jaidon Sponsel, PA-C for today's virtual visit.  While this provider is not your primary care provider (PCP), if your PCP is located in our provider database this encounter information will be shared with them immediately following your visit. ? ?Consent: ?(Patient) George Hewdward G Oberholzer provided verbal consent for this virtual visit at the beginning of the encounter. ? ?Current Medications: ? ?Current Outpatient Medications:  ?  molnupiravir EUA (LAGEVRIO) 200 mg CAPS capsule, Take 4 capsules (800 mg total) by mouth 2 (two) times daily for 5 days., Disp: 40 capsule, Rfl: 0 ?  albuterol (PROVENTIL HFA;VENTOLIN HFA) 108 (90 Base) MCG/ACT inhaler, Inhale 2 puffs into the lungs every 6 (six) hours as needed for wheezing or shortness of breath., Disp: 18 g, Rfl: 3 ?  aspirin EC 81 MG tablet, Take 1 tablet (81 mg total) by mouth daily., Disp: , Rfl:  ?  Cetirizine HCl (ZYRTEC ALLERGY PO), Take 10 mg by mouth daily.  (Patient not taking: Reported on 09/20/2020), Disp: , Rfl:  ?  doxycycline (VIBRA-TABS) 100 MG tablet, Take 1 tablet (100 mg total) by mouth 2 (two) times daily., Disp: 14 tablet, Rfl: 0 ?  enalapril (VASOTEC) 20 MG tablet, Take 1 tablet (20 mg total) by mouth daily., Disp: 90 tablet, Rfl: 1 ?  sildenafil (VIAGRA) 100 MG tablet, TAKE 1 TABLET BY MOUTH AS DIRECTED -Do NOT take with nitrates or within 4 hours of an alpha BLOCKER (Patient not taking: Reported on 11/14/2021), Disp: 24 tablet, Rfl: 2 ?  SYRINGE-NEEDLE, DISP, 3 ML 25G X 1" 3 ML MISC, 3 mLs by Does not apply route every morning. (Patient not taking: Reported on 11/14/2021), Disp: 5 each, Rfl: 4 ?  testosterone cypionate (DEPOTESTOSTERONE CYPIONATE) 200 MG/ML injection, Inject 1 mL (200 mg total) into the muscle every 14 (fourteen) days. (Patient not taking: Reported on 11/14/2021), Disp: 10 mL, Rfl: 0  ? ?Medications ordered in this encounter:  ?Meds ordered this encounter  ?Medications  ? molnupiravir EUA  (LAGEVRIO) 200 mg CAPS capsule  ?  Sig: Take 4 capsules (800 mg total) by mouth 2 (two) times daily for 5 days.  ?  Dispense:  40 capsule  ?  Refill:  0  ?  Order Specific Question:   Supervising Provider  ?  Answer:   Eber HongMILLER, BRIAN [[id number]]  ?  ? ?*If you need refills on other medications prior to your next appointment, please contact your pharmacy* ? ?Follow-Up: ?Call back or seek an in-person evaluation if the symptoms worsen or if the condition fails to improve as anticipated. ? ?Other Instructions ?Molnupiravir Oral Capsules ?What is this medication? ?MOLNUPIRAVIR (mol nue pir a vir) treats COVID-19. It is an antiviral medication. It may decrease the risk of developing severe symptoms of COVID-19. It may also decrease the chance of going to the hospital. This medication is not approved by the FDA. The FDA has authorized emergency use of this medication during the COVID-19 pandemic. ?This medicine may be used for other purposes; ask your health care provider or pharmacist if you have questions. ?COMMON BRAND NAME(S): LAGEVRIO ?What should I tell my care team before I take this medication? ?They need to know if you have any of these conditions: ?Any allergies ?Any serious illness ?An unusual or allergic reaction to molnupiravir, other medications, foods, dyes, or preservatives ?Pregnant or trying to get pregnant ?Breast-feeding ?How should I use this medication? ?Take this medication by mouth with water. Take it as directed  on the prescription label at the same time every day. Do not cut, crush or chew this medication. Swallow the capsules whole. You can take it with or without food. If it upsets your stomach, take it with food. Take all of this medication unless your care team tells you to stop it early. Keep taking it even if you think you are better. ?Talk to your care team about the use of this medication in children. Special care may be needed. ?Overdosage: If you think you have taken too much of this medicine  contact a poison control center or emergency room at once. ?NOTE: This medicine is only for you. Do not share this medicine with others. ?What if I miss a dose? ?If you miss a dose, take it as soon as you can unless it is more than 10 hours late. If it is more than 10 hours late, skip the missed dose. Take the next dose at the normal time. Do not take extra or 2 doses at the same time to make up for the missed dose. ?What may interact with this medication? ?Interactions have not been studied. ?This list may not describe all possible interactions. Give your health care provider a list of all the medicines, herbs, non-prescription drugs, or dietary supplements you use. Also tell them if you smoke, drink alcohol, or use illegal drugs. Some items may interact with your medicine. ?What should I watch for while using this medication? ?Your condition will be monitored carefully while you are receiving this medication. Visit your care team for regular checkups. Tell your care team if your symptoms do not start to get better or if they get worse. ?Do not become pregnant while taking this medication. You may need a pregnancy test before starting this medication. Women must use a reliable form of birth control while taking this medication and for 4 days after stopping the medication. Women should inform their care team if they wish to become pregnant or think they might be pregnant. Men should not father a child while taking this medication and for 3 months after stopping it. There is potential for serious harm to an unborn child. Talk to your care team for more information. Do not breast-feed an infant while taking this medication and for 4 days after stopping the medication. ?What side effects may I notice from receiving this medication? ?Side effects that you should report to your care team as soon as possible: ?Allergic reactions--skin rash, itching, hives, swelling of the face, lips, tongue, or throat ?Side effects that  usually do not require medical attention (report these to your care team if they continue or are bothersome): ?Diarrhea ?Dizziness ?Nausea ?This list may not describe all possible side effects. Call your doctor for medical advice about side effects. You may report side effects to FDA at 1-800-FDA-1088. ?Where should I keep my medication? ?Keep out of the reach of children and pets. ?Store at room temperature between 20 and 25 degrees C (68 and 77 degrees F). Get rid of any unused medication after the expiration date. ?To get rid of medications that are no longer needed or have expired: ?Take the medication to a medication take-back program. Check with your pharmacy or law enforcement to find a location. ?If you cannot return the medication, check the label or package insert to see if the medication should be thrown out in the garbage or flushed down the toilet. If you are not sure, ask your care team. If it is safe to put  it in the trash, take the medication out of the container. Mix the medication with cat litter, dirt, coffee grounds, or other unwanted substance. Seal the mixture in a bag or container. Put it in the trash. ?NOTE: This sheet is a summary. It may not cover all possible information. If you have questions about this medicine, talk to your doctor, pharmacist, or health care provider. ?? 2022 Elsevier/Gold Standard (2020-09-13 00:00:00) ? ? ? ?If you have been instructed to have an in-person evaluation today at a local Urgent Care facility, please use the link below. It will take you to a list of all of our available [facility], including address, phone number and hours of operation. Please do not delay care.  ?[facility] ? ?If you or a family member do not have a primary care provider, use the link below to schedule a visit and establish care. When you choose a [facility] primary care physician or advanced practice provider, you gain a long-term partner in health. ?Find a  Primary Care Provider ? ?Learn more about [facility]'s in-office and virtual care options: ?[facility] ?

## 2021-11-21 NOTE — Telephone Encounter (Signed)
Outbound call placed to return patient's call; left voicemail to report exposure to COVID by caring for wife and father who both have COVID. Patient stated he ended up getting a virtual appt and was prescribed some medication to treat sx: body aches, chills, congestion, weakness/fatigue. Symptoms began this morning.  ? ?Negative COVID test result 3/5.  ? ?Will call back if anything additional needed.  ?

## 2021-11-21 NOTE — Progress Notes (Signed)
Based on the information that you have shared in the e-Visit Questionnaire, we recommend that you convert this visit to a video visit in order for the provider to better assess what is going on.  The provider will be able to give you a more accurate diagnosis and treatment plan if we can more freely discuss your symptoms and with the addition of a virtual examination.  ° °If you desire the FDA-emergency use antiviral medication options, we do require a video visit for that. To access a video visit please select the "Virtual Urgent Care Visit" option in the MyChart menu. ° °If you convert to a video visit, we will bill your insurance (similar to an office visit) and you will not be charged for this e-Visit. You will be able to stay at home and speak with the first available [facility] advanced practice provider. The link to do a video visit is in the drop down Menu tab of your Welcome screen in MyChart. ° °I provided 5 minutes of non face-to-face time during this encounter for chart review and documentation.  ° °

## 2021-12-19 ENCOUNTER — Ambulatory Visit (INDEPENDENT_AMBULATORY_CARE_PROVIDER_SITE_OTHER): Payer: Managed Care, Other (non HMO) | Admitting: Family Medicine

## 2021-12-19 ENCOUNTER — Encounter: Payer: Self-pay | Admitting: Family Medicine

## 2021-12-19 VITALS — BP 148/102 | HR 81 | Temp 97.3°F | Ht 72.0 in | Wt 318.6 lb

## 2021-12-19 DIAGNOSIS — I10 Essential (primary) hypertension: Secondary | ICD-10-CM | POA: Diagnosis not present

## 2021-12-19 DIAGNOSIS — E291 Testicular hypofunction: Secondary | ICD-10-CM

## 2021-12-19 DIAGNOSIS — Z0000 Encounter for general adult medical examination without abnormal findings: Secondary | ICD-10-CM

## 2021-12-19 DIAGNOSIS — Z125 Encounter for screening for malignant neoplasm of prostate: Secondary | ICD-10-CM | POA: Diagnosis not present

## 2021-12-19 DIAGNOSIS — R739 Hyperglycemia, unspecified: Secondary | ICD-10-CM

## 2021-12-19 NOTE — Progress Notes (Signed)
? ?Subjective:  ? ? Patient ID: George Brock, male    DOB: 04-05-1966, 56 y.o.   MRN: [id number] ? ?HPI ?Patient is a very pleasant 56 year old Caucasian gentleman who presents today for complete physical exam.  His last colonoscopy was in 2015 and is not due again until 2020.  He is overdue for PSA screening [place] cancer.  He is also due for a CT scan of his lungs to screen for colon cancer however he declines for me to schedule this at the present time.  He is also due for Prevnar 20 as well as Shingrix.  He declines all vaccinations. ? ?Past Medical History:  ?Diagnosis Date  ? Allergy   ? seasonal  ? Anxiety   ? work related  ? Asthma   ? Erectile dysfunction   ? GERD (gastroesophageal reflux disease)   ? Heart murmur   ? Tobacco abuse   ? ?Past Surgical History:  ?Procedure Laterality Date  ? CARDIOVASCULAR STRESS TEST    ? cardiolite  ? LEFT HEART CATHETERIZATION WITH CORONARY ANGIOGRAM N/A 08/03/2014  ? Procedure: LEFT HEART CATHETERIZATION WITH CORONARY ANGIOGRAM;  Surgeon: Lennette Bihari, MD;  Location[facility] C[facility] LAB;  Service: Cardiovascular;  Laterality: N/A;  ? US ECHOCARDIOGRAPHY  06/07/2004  ? ?Current Outpatient Medications on File Prior to Visit  ?Medication Sig Dispense Refill  ? albuterol (PROVENTIL HFA;VENTOLIN HFA) 108 (90 Base) MCG/ACT inhaler Inhale 2 puffs into the lungs every 6 (six) hours as needed for wheezing or shortness of breath. 18 g 3  ? aspirin EC 81 MG tablet Take 1 tablet (81 mg total) by mouth daily.    ? Cetirizine HCl (ZYRTEC ALLERGY PO) Take 10 mg by mouth daily.    ? doxycycline (VIBRA-TABS) 100 MG tablet Take 1 tablet (100 mg total) by mouth 2 (two) times daily. 14 tablet 0  ? sildenafil (VIAGRA) 100 MG tablet TAKE 1 TABLET BY MOUTH AS DIRECTED -Do NOT take with nitrates or within 4 hours of an alpha BLOCKER 24 tablet 2  ? SYRINGE-NEEDLE, DISP, 3 ML 25G X 1" 3 ML MISC 3 mLs by Does not apply route every morning. 5 each 4  ? testosterone cypionate (DEPOTESTOSTERONE CYPIONATE)  200 MG/ML injection Inject 1 mL (200 mg total) into the muscle every 14 (fourteen) days. 10 mL 0  ? enalapril (VASOTEC) 20 MG tablet Take 1 tablet (20 mg total) by mouth daily. 90 tablet 1  ? ?No current facility-administered medications on file prior to visit.  ? ?Allergies  ?Allergen Reactions  ? Codeine Other (See Comments)  ?  Stomach upset  ? ?Social History  ? ?Socioeconomic History  ? Marital status: Married  ?  Spouse name: Not on file  ? Number of children: Not on file  ? Years of education: Not on file  ? Highest education level: Not on file  ?Occupational History  ? Occupation: [employer], Technical sales engineer  ?Tobacco Use  ? Smoking status: Some Days  ?  Packs/day: 0.50  ?  Types: Cigarettes  ? Smokeless tobacco: Never  ?Substance and Sexual Activity  ? Alcohol use: Yes  ?  Alcohol/week: 3.0 - 4.0 standard drinks  ?  Types: 2 Glasses of wine, 1 - 2 Standard drinks or equivalent per week  ? Drug use: No  ? Sexual activity: Yes  ?  Birth control/protection: None  ?Other Topics Concern  ? Not on file  ?Social History Narrative  ? Not on file  ? ?Social Determinants of  Health  ? ?Financial Resource Strain: Not on file  ?Food Insecurity: Not on file  ?Transportation Needs: Not on file  ?Physical Activity: Not on file  ?Stress: Not on file  ?Social Connections: Not on file  ?Intimate Partner Violence: Not on file  ? ?Family History  ?Problem Relation Age of Onset  ? CAD Mother   ? Diabetes Mother   ? Heart disease Mother   ? Cancer Father   ? Hypertension Father   ? ? ? ? ?Review of Systems ? ?   ?Objective:  ? Physical Exam ?Vitals reviewed.  ?Constitutional:   ?   General: He is not in acute distress. ?   Appearance: He is obese. He is not ill-appearing, toxic-appearing or diaphoretic.  ?HENT:  ?   Head: Normocephalic and atraumatic.  ?   Right Ear: Tympanic membrane and ear canal normal. There is no impacted cerumen.  ?   Left Ear: Tympanic membrane and ear canal normal. There is no impacted cerumen.  ?    Nose: Nose normal. No congestion or rhinorrhea.  ?   Mouth/Throat:  ?   Mouth: Mucous membranes are moist.  ?   Pharynx: No oropharyngeal exudate or posterior oropharyngeal erythema.  ?Eyes:  ?   General: No scleral icterus.    ?   Right eye: No discharge.     ?   Left eye: No discharge.  ?   Extraocular Movements: Extraocular movements intact.  ?   Conjunctiva/sclera: Conjunctivae normal.  ?   Pupils: Pupils are equal, round, and reactive to light.  ?Neck:  ?   Vascular: No carotid bruit.  ?Cardiovascular:  ?   Rate and Rhythm: Normal rate and regular rhythm.  ?   Pulses: Normal pulses.  ?   Heart sounds: Normal heart sounds. No murmur heard. ?  No friction rub. No gallop.  ?Pulmonary:  ?   Effort: Pulmonary effort is normal. No respiratory distress.  ?   Breath sounds: Normal breath sounds. No stridor. No wheezing, rhonchi or rales.  ?Chest:  ?   Chest wall: No tenderness.  ?Abdominal:  ?   General: Bowel sounds are normal. There is no distension.  ?   Palpations: Abdomen is soft. There is no mass.  ?   Tenderness: There is no abdominal tenderness. There is no guarding or rebound.  ?   Hernia: No hernia is present.  ?Musculoskeletal:  ?   Left shoulder: Tenderness present. Decreased range of motion. Decreased strength.  ?   Cervical back: Neck supple.  ?   Right knee: Decreased range of motion. Tenderness present over the medial joint line and lateral joint line.  ?   Right lower leg: No edema.  ?   Left lower leg: No edema.  ?Skin: ?   General: Skin is warm.  ?   Coloration: Skin is not jaundiced or pale.  ?   Findings: No bruising, erythema, lesion or rash.  ?Neurological:  ?   General: No focal deficit present.  ?   Mental Status: He is alert and oriented to person, place, and time. Mental status is at baseline.  ?   Cranial Nerves: No cranial nerve deficit.  ?   Sensory: No sensory deficit.  ?   Motor: No weakness.  ?   Coordination: Coordination normal.  ?   Gait: Gait normal.  ?   Deep Tendon Reflexes:  Reflexes normal.  ?Psychiatric:     ?   Mood and Affect: Mood  normal.     ?   Behavior: Behavior normal.     ?   Thought Content: Thought content normal.     ?   Judgment: Judgment normal.  ? ? ? ? ?   ?Assessment & Plan:  ?Prostate cancer screening - Plan: PSA ? ?Hypogonadism in male - Plan: CBC with Differential/Platelet, Lipid panel, COMPLETE METABOLIC PANEL WITH GFR, PSA, Testosterone Total,Free,Bio, Males ? ?Essential hypertension - Plan: CBC with Differential/Platelet, Lipid panel, COMPLETE METABOLIC PANEL WITH GFR ? ?Morbid obesity (HCC) - Plan: CBC with Differential/Platelet, Lipid panel, COMPLETE METABOLIC PANEL WITH GFR, Hemoglobin A1c ? ?Elevated blood sugar - Plan: Hemoglobin A1c ?Check for prostate cancer with PSA.  Colonoscopy is up-to-date.  Recommended a lung CT for lung cancer screening with the patient declined.  Check CBC CMP fasting lipid panel.  Goal LDL cholesterol is less than 100.  Check a testosterone level given his history of hypogonadism.  Recommended smoking cessation.  Patient declines pneumonia vaccine as well as shingles vaccine.  Blood pressure today is very high.  We will wait for his lab work come back and if there are no contraindications I will add hydrochlorothiazide 25 mg daily ?

## 2021-12-20 LAB — CBC WITH DIFFERENTIAL/PLATELET
Absolute Monocytes: 343 cells/uL (ref 200–950)
Basophils Absolute: 73 cells/uL (ref 0–200)
Basophils Relative: 1.1 %
Eosinophils Absolute: 231 cells/uL (ref 15–500)
Eosinophils Relative: 3.5 %
HCT: 49.3 % (ref 38.5–50.0)
Hemoglobin: 16.9 g/dL (ref 13.2–17.1)
Lymphs Abs: 1624 cells/uL (ref 850–3900)
MCH: 32.8 pg (ref 27.0–33.0)
MCHC: 34.3 g/dL (ref 32.0–36.0)
MCV: 95.5 fL (ref 80.0–100.0)
MPV: 9.2 fL (ref 7.5–12.5)
Monocytes Relative: 5.2 %
Neutro Abs: 4330 cells/uL (ref 1500–7800)
Neutrophils Relative %: 65.6 %
Platelets: 217 10*3/uL (ref 140–400)
RBC: 5.16 10*6/uL (ref 4.20–5.80)
RDW: 12.9 % (ref 11.0–15.0)
Total Lymphocyte: 24.6 %
WBC: 6.6 10*3/uL (ref 3.8–10.8)

## 2021-12-20 LAB — TESTOSTERONE TOTAL,FREE,BIO, MALES
Albumin: 4.2 g/dL (ref 3.6–5.1)
Sex Hormone Binding: 38 nmol/L (ref 22–77)
Testosterone, Bioavailable: 97.8 ng/dL — ABNORMAL LOW (ref 110.0–575.0)
Testosterone, Free: 50.8 pg/mL (ref 46.0–224.0)
Testosterone: 426 ng/dL (ref 250–827)

## 2021-12-20 LAB — COMPLETE METABOLIC PANEL WITH GFR
AG Ratio: 1.6 (calc) (ref 1.0–2.5)
ALT: 21 U/L (ref 9–46)
AST: 17 U/L (ref 10–35)
Albumin: 4.2 g/dL (ref 3.6–5.1)
Alkaline phosphatase (APISO): 79 U/L (ref 35–144)
BUN: 10 mg/dL (ref 7–25)
CO2: 26 mmol/L (ref 20–32)
Calcium: 9.2 mg/dL (ref 8.6–10.3)
Chloride: 103 mmol/L (ref 98–110)
Creat: 0.86 mg/dL (ref 0.70–1.30)
Globulin: 2.6 g/dL (calc) (ref 1.9–3.7)
Glucose, Bld: 112 mg/dL — ABNORMAL HIGH (ref 65–99)
Potassium: 4.5 mmol/L (ref 3.5–5.3)
Sodium: 138 mmol/L (ref 135–146)
Total Bilirubin: 0.4 mg/dL (ref 0.2–1.2)
Total Protein: 6.8 g/dL (ref 6.1–8.1)
eGFR: 101 mL/min/{1.73_m2} (ref >=60)

## 2021-12-20 LAB — LIPID PANEL
Cholesterol: 123 mg/dL (ref <200)
HDL: 43 mg/dL (ref >=40)
LDL Cholesterol (Calc): 66 mg/dL (calc)
Non-HDL Cholesterol (Calc): 80 mg/dL (calc) (ref <130)
Total CHOL/HDL Ratio: 2.9 (calc) (ref <5.0)
Triglycerides: 68 mg/dL (ref <150)

## 2021-12-20 LAB — HEMOGLOBIN A1C
Hgb A1c MFr Bld: 5.8 % of total Hgb — ABNORMAL HIGH (ref <5.7)
Mean Plasma Glucose: 120 mg/dL
eAG (mmol/L): 6.6 mmol/L

## 2021-12-20 LAB — PSA: PSA: 0.36 ng/mL (ref <=4.00)

## 2021-12-21 ENCOUNTER — Telehealth: Payer: Self-pay

## 2021-12-21 NOTE — Telephone Encounter (Signed)
Per pt started taking his bp meds again (Enalpril 20mg ) due high b/p.  ? ?However when he took it his bp reading were low and he does not feel good, 128/70 and 128/82 today per pt ?

## 2021-12-26 NOTE — Telephone Encounter (Signed)
Pt aware and voiced understanding 

## 2022-07-03 ENCOUNTER — Ambulatory Visit: Payer: Managed Care, Other (non HMO) | Admitting: Family Medicine

## 2022-07-03 VITALS — BP 130/86 | HR 86 | Ht 72.0 in | Wt 330.0 lb

## 2022-07-03 DIAGNOSIS — E291 Testicular hypofunction: Secondary | ICD-10-CM

## 2022-07-03 DIAGNOSIS — F4321 Adjustment disorder with depressed mood: Secondary | ICD-10-CM

## 2022-07-03 MED ORDER — TESTOSTERONE 1.62 % TD GEL: 2.0000 | Freq: Every day | TRANSDERMAL | 0 refills | Status: DC

## 2022-07-03 MED ORDER — VENLAFAXINE HCL ER 75 MG PO CP24: 75.0000 mg | ORAL_CAPSULE | Freq: Every day | ORAL | 3 refills | Status: DC

## 2022-07-03 NOTE — Progress Notes (Signed)
Subjective:    Patient ID: George Brock, male    DOB: 1966-09-15, 56 y.o.   MRN: [id number]  Patient is a 56 year old Caucasian gentleman who presents today originally complaining of right knee pain requesting a cortisone injection.  However, I feel that he mainly just wanted to talk.  He is undergoing a tremendous amount of stress at the present time.  His father has been diagnosed with cancer.  His father-in-law has been diagnosed with stage IV lung cancer.  He just lost his aunt to cancer.  Simultaneously, his wife is battling ataxia and urinary incontinence.  She has been evaluated by neurology and they are concerned that she may have multiple sclerosis.  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  He reports feeling depressed and overwhelmed.  He is also struggling with obesity.  I am proud of the patient because he quit smoking.  However he has gained weight.  He states he cannot exercise due to the knee pain.  Furthermore he just feels tired and has no energy.  He is trying to diet and eat healthy however this has not been successful for him.  His last lab work in April showed prediabetes.  He also has testosterone deficiency however his wife has gotten so weak that she is no longer able to perform the injections and he is unable to give himself shots.  He is interested in switching to topical testosterone treatment.  His BMI is 45  Past Medical History:  Diagnosis Date   Allergy    seasonal   Anxiety    work related   Asthma    Erectile dysfunction    GERD (gastroesophageal reflux disease)    Heart murmur    Tobacco abuse    Past Surgical History:  Procedure Laterality Date   CARDIOVASCULAR STRESS TEST     cardiolite   LEFT HEART CATHETERIZATION WITH CORONARY ANGIOGRAM N/A 08/03/2014   Procedure: LEFT HEART CATHETERIZATION WITH CORONARY ANGIOGRAM;  Surgeon:  Lennette Bihari, MD;  Location: [facility] CATH LAB;  Service: Cardiovascular;  Laterality: N/A;   US ECHOCARDIOGRAPHY  06/07/2004   Current Outpatient Medications on File Prior to Visit  Medication Sig Dispense Refill   albuterol (PROVENTIL HFA;VENTOLIN HFA) 108 (90 Base) MCG/ACT inhaler Inhale 2 puffs into the lungs every 6 (six) hours as needed for wheezing or shortness of breath. 18 g 3   aspirin EC 81 MG tablet Take 1 tablet (81 mg total) by mouth daily.     Cetirizine HCl (ZYRTEC ALLERGY PO) Take 10 mg by mouth daily.     doxycycline (VIBRA-TABS) 100 MG tablet Take 1 tablet (100 mg total) by mouth 2 (two) times daily. 14 tablet 0   enalapril (VASOTEC) 20 MG tablet Take 1 tablet (20 mg total) by mouth daily. 90 tablet 1   sildenafil (VIAGRA) 100 MG tablet TAKE 1 TABLET BY MOUTH AS DIRECTED -Do NOT take with nitrates or within 4 hours of an alpha BLOCKER 24 tablet 2   SYRINGE-NEEDLE, DISP, 3 ML 25G X 1" 3 ML MISC 3 mLs by Does not apply route every morning. 5 each 4   testosterone cypionate (DEPOTESTOSTERONE CYPIONATE) 200 MG/ML injection Inject 1 mL (200 mg total) into the muscle every 14 (fourteen) days. 10 mL 0   No current facility-administered medications on file prior to visit.   Allergies  Allergen  Reactions   Codeine Other (See Comments)    Stomach upset   Social History   Socioeconomic History   Marital status: Married    Spouse name: Not on file   Number of children: Not on file   Years of education: Not on file   Highest education level: Not on file  Occupational History   Occupation: [employer], Secondary school teacher  Tobacco Use   Smoking status: Some Days    Packs/day: 0.50    Types: Cigarettes   Smokeless tobacco: Never  Substance and Sexual Activity   Alcohol use: Yes    Alcohol/week: 3.0 - 4.0 standard drinks of alcohol    Types: 2 Glasses of wine, 1 - 2 Standard drinks or equivalent per week   Drug use: No   Sexual activity: Yes    Birth control/protection: None   Other Topics Concern   Not on file  Social History Narrative   Not on file   Social Determinants of Health   Financial Resource Strain: Not on file  Food Insecurity: Not on file  Transportation Needs: Not on file  Physical Activity: Not on file  Stress: Not on file  Social Connections: Not on file  Intimate Partner Violence: Not on file      Review of Systems  All other systems reviewed and are negative.      Objective:   Physical Exam Vitals reviewed.  Constitutional:      General: He is not in acute distress.    Appearance: Normal appearance. He is obese. He is not ill-appearing or toxic-appearing.  Cardiovascular:     Rate and Rhythm: Normal rate and regular rhythm.     Pulses: Normal pulses.     Heart sounds: Normal heart sounds. No murmur heard.    No friction rub. No gallop.  Pulmonary:     Effort: Pulmonary effort is normal.     Breath sounds: No wheezing, rhonchi or rales.  Neurological:     Mental Status: He is alert.           Assessment & Plan:  Hypogonadism in male  Morbid obesity ([place])  Situational depression Honestly, very little exam was performed today.  The majority of our time was spent talking.  I offered to give the patient a cortisone injection in his knee however he politely declined it.  I truly feel that he just wanted to talk and offload some of the stress that he feels.  We discussed treatments for depression.  He has tried Lexapro and Wellbutrin in the past.  We discussed that he would like to try venlafaxine extended release 75 mg p.o. every morning.  I will increase to 150 mg in 1 month if necessary.  Reassess in 4 weeks.  Second we discussed weight loss.  In addition to his dietary changes I encouraged him to try to exercise much as possible.  We discussed Mancel Parsons and he will check with his insurance to see whether this is covered.  Third I will switch his injectable testosterone to testosterone 1.62% gel 2 pumps daily and recommended  rechecking lab work in 3 months.  Also recommended offloading as much volunteer stress as he could possibly do.

## 2022-07-04 ENCOUNTER — Telehealth: Payer: Self-pay

## 2022-07-04 ENCOUNTER — Other Ambulatory Visit: Payer: Self-pay | Admitting: Family Medicine

## 2022-07-04 MED ORDER — WEGOVY 0.5 MG/0.5ML ~~LOC~~ SOAJ: 0.5000 mg | SUBCUTANEOUS | 1 refills | Status: DC

## 2022-07-04 NOTE — Telephone Encounter (Signed)
Pt called and states he called his insurance and they will cover Wegovy and asks if an Rx can be sent in for him? Thank you.

## 2022-07-05 ENCOUNTER — Other Ambulatory Visit: Payer: Self-pay

## 2022-07-05 DIAGNOSIS — I10 Essential (primary) hypertension: Secondary | ICD-10-CM

## 2022-07-05 MED ORDER — ENALAPRIL MALEATE 20 MG PO TABS: 20.0000 mg | ORAL_TABLET | Freq: Every day | ORAL | 3 refills | Status: DC

## 2022-07-10 ENCOUNTER — Ambulatory Visit: Payer: Managed Care, Other (non HMO) | Admitting: Family Medicine

## 2022-07-10 VITALS — BP 124/82 | HR 90 | Temp 99.2°F | Ht 72.0 in | Wt 330.0 lb

## 2022-07-10 DIAGNOSIS — J069 Acute upper respiratory infection, unspecified: Secondary | ICD-10-CM | POA: Diagnosis not present

## 2022-07-10 MED ORDER — PREDNISONE 20 MG PO TABS: ORAL_TABLET | ORAL | 0 refills | Status: DC

## 2022-07-10 MED ORDER — ALBUTEROL SULFATE HFA 108 (90 BASE) MCG/ACT IN AERS: 2.0000 | INHALATION_SPRAY | Freq: Four times a day (QID) | RESPIRATORY_TRACT | 3 refills | Status: AC | PRN

## 2022-07-10 NOTE — Progress Notes (Signed)
Subjective:    Patient ID: George Brock, male    DOB: [birth year], 56 y.o.   MRN: [id number]  Patient developed head congestion starting Friday.  He reports sinus pressure, postnasal drip, rhinorrhea, and a low-grade fever.  He is also coughing.  On exam today, he has diminished breath sounds all throughout and expiratory wheezing in all 4 lung fields.  He has a history of smoking and just recently quit smoking.  I believe he likely has some underlying emphysema that has likely been exacerbated by the current infection.  He denies any chest pain or pleurisy or hemoptysis. Past Medical History:  Diagnosis Date   Allergy    seasonal   Anxiety    work related   Asthma    Erectile dysfunction    GERD (gastroesophageal reflux disease)    Heart murmur    Tobacco abuse    Past Surgical History:  Procedure Laterality Date   CARDIOVASCULAR STRESS TEST     cardiolite   LEFT HEART CATHETERIZATION WITH CORONARY ANGIOGRAM N/A 08/03/2014   Procedure: LEFT HEART CATHETERIZATION WITH CORONARY ANGIOGRAM;  Surgeon: Lennette Bihari, MD;  Location[facility] C[facility] LAB;  Service: Cardiovascular;  Laterality: N/A;   US ECHOCARDIOGRAPHY  06/07/2004   Current Outpatient Medications on File Prior to Visit  Medication Sig Dispense Refill   albuterol (PROVENTIL HFA;VENTOLIN HFA) 108 (90 Base) MCG/ACT inhaler Inhale 2 puffs into the lungs every 6 (six) hours as needed for wheezing or shortness of breath. 18 g 3   aspirin EC 81 MG tablet Take 1 tablet (81 mg total) by mouth daily.     Cetirizine HCl (ZYRTEC ALLERGY PO) Take 10 mg by mouth daily.     enalapril (VASOTEC) 20 MG tablet Take 1 tablet (20 mg total) by mouth daily. 90 tablet 3   Semaglutide-Weight Management (WEGOVY) 0.5 MG/0.5ML SOAJ Inject 0.5 mg into the skin once a week. Increase dose monthly as tolerated. 2 mL 1   sildenafil (VIAGRA) 100 MG tablet TAKE 1 TABLET BY MOUTH AS DIRECTED -Do NOT take with nitrates or within 4 hours of an alpha BLOCKER 24  tablet 2   SYRINGE-NEEDLE, DISP, 3 ML 25G X 1" 3 ML MISC 3 mLs by Does not apply route every morning. 5 each 4   Testosterone 1.62 % GEL Place 2 Pump onto the skin daily. 75 g 0   venlafaxine XR (EFFEXOR XR) 75 MG 24 hr capsule Take 1 capsule (75 mg total) by mouth daily with breakfast. 90 capsule 3   No current facility-administered medications on file prior to visit.   Allergies  Allergen Reactions   Codeine Other (See Comments)    Stomach upset   Social History   Socioeconomic History   Marital status: Married    Spouse name: Not on file   Number of children: Not on file   Years of education: Not on file   Highest education level: Not on file  Occupational History   Occupation: [employer], Technical sales engineer  Tobacco Use   Smoking status: Some Days    Packs/day: 0.50    Types: Cigarettes   Smokeless tobacco: Never  Substance and Sexual Activity   Alcohol use: Yes    Alcohol/week: 3.0 - 4.0 standard drinks of alcohol    Types: 2 Glasses of wine, 1 - 2 Standard drinks or equivalent per week   Drug use: No   Sexual activity: Yes    Birth control/protection: None  Other Topics Concern  Not on file  Social History Narrative   Not on file   Social Determinants of Health   Financial Resource Strain: Not on file  Food Insecurity: Not on file  Transportation Needs: Not on file  Physical Activity: Not on file  Stress: Not on file  Social Connections: Not on file  Intimate Partner Violence: Not on file      Review of Systems  All other systems reviewed and are negative.      Objective:   Physical Exam Vitals reviewed.  Constitutional:      General: He is not in acute distress.    Appearance: Normal appearance. He is obese. He is not ill-appearing or toxic-appearing.  HENT:     Right Ear: Tympanic membrane and ear canal normal.     Left Ear: Tympanic membrane and ear canal normal.     Nose: Congestion and rhinorrhea present.  Cardiovascular:     Rate and  Rhythm: Normal rate and regular rhythm.     Pulses: Normal pulses.     Heart sounds: Normal heart sounds. No murmur heard.    No friction rub. No gallop.  Pulmonary:     Effort: Pulmonary effort is normal.     Breath sounds: Wheezing present. No rhonchi or rales.  Neurological:     Mental Status: He is alert.           Assessment & Plan:  Viral URI with cough Begin prednisone taper pack along with albuterol 2 puffs every 4-6 hours as needed for wheezing and bronchospasm.  Treat the head congestion and rhinorrhea symptomatically with Coricidin HBP and Mucinex.  Allow tincture of time.  The patient took a negative COVID test on Saturday.  I recommended that he repeat COVID test today or tomorrow.  I am concerned that he may have had a false negative.  If the COVID test is positive I will call out Paxlovid.  Otherwise I suspect that this is likely a viral upper respiratory infection

## 2022-07-17 ENCOUNTER — Telehealth: Payer: Self-pay

## 2022-07-17 ENCOUNTER — Other Ambulatory Visit: Payer: Self-pay | Admitting: Family Medicine

## 2022-07-17 MED ORDER — AZITHROMYCIN 250 MG PO TABS: ORAL_TABLET | ORAL | 0 refills | Status: DC

## 2022-07-17 NOTE — Telephone Encounter (Signed)
Pt called in stating that he has taken another Covid test that has turned out negative. Pt wanted pcp/nurse to know this so that possibly he could get some kind of antibiotic sent to pharmacy please. Please advise.  Cb#: [phone]

## 2022-09-05 ENCOUNTER — Other Ambulatory Visit: Payer: Self-pay | Admitting: Family Medicine

## 2022-11-14 ENCOUNTER — Ambulatory Visit: Payer: Managed Care, Other (non HMO) | Admitting: Family Medicine

## 2022-11-20 ENCOUNTER — Encounter: Payer: Self-pay | Admitting: Family Medicine

## 2022-11-20 ENCOUNTER — Ambulatory Visit: Payer: Managed Care, Other (non HMO) | Admitting: Family Medicine

## 2022-11-20 VITALS — BP 132/84 | HR 83 | Ht 72.0 in | Wt 295.0 lb

## 2022-11-20 DIAGNOSIS — R7303 Prediabetes: Secondary | ICD-10-CM | POA: Diagnosis not present

## 2022-11-20 DIAGNOSIS — E291 Testicular hypofunction: Secondary | ICD-10-CM

## 2022-11-20 DIAGNOSIS — R739 Hyperglycemia, unspecified: Secondary | ICD-10-CM

## 2022-11-20 LAB — URINALYSIS, ROUTINE W REFLEX MICROSCOPIC
Bacteria, UA: NONE SEEN /HPF
Hyaline Cast: NONE SEEN /LPF
Leukocytes,Ua: NEGATIVE
Nitrite: NEGATIVE
Specific Gravity, Urine: 1.015 (ref 1.001–1.035)
Squamous Epithelial / HPF: NONE SEEN /HPF (ref <=5)
WBC, UA: NONE SEEN /HPF (ref 0–5)
pH: 5.5 (ref 5.0–8.0)

## 2022-11-20 LAB — MICROSCOPIC MESSAGE

## 2022-11-20 MED ORDER — GLIPIZIDE ER 5 MG PO TB24: 5.0000 mg | ORAL_TABLET | Freq: Every day | ORAL | 5 refills | Status: DC

## 2022-11-20 NOTE — Progress Notes (Signed)
Subjective:    Patient ID: George Brock, male    DOB: 06/10/1966, 57 y.o.   MRN: [id number] 07/03/22 Patient is a 57 year old Caucasian gentleman who presents today originally complaining of right knee pain requesting a cortisone injection.  However, I feel that he mainly just wanted to talk.  He is undergoing a tremendous amount of stress at the present time.  His father has been diagnosed with cancer.  His father-in-law has been diagnosed with stage IV lung cancer.  He just lost his aunt to cancer.  Simultaneously, his wife is battling ataxia and urinary incontinence.  She has been evaluated by neurology and they are concerned that she may have multiple sclerosis.  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  He reports feeling depressed and overwhelmed.  He is also struggling with obesity.  I am proud of the patient because he quit smoking.  However he has gained weight.  He states he cannot exercise due to the knee pain.  Furthermore he just feels tired and has no energy.  He is trying to diet and eat healthy however this has not been successful for him.  His last lab work in April showed prediabetes.  He also has testosterone deficiency however his wife has gotten so weak that she is no longer able to perform the injections and he is unable to give himself shots.  He is interested in switching to topical testosterone treatment.  His BMI is 45.  At that time, my plan was: Honestly, very little exam was performed today.  The majority of our time was spent talking.  I offered to give the patient a cortisone injection in his knee however he politely declined it.  I truly feel that he just wanted to talk and offload some of the stress that he feels.  We discussed treatments for depression.  He has tried Lexapro and Wellbutrin in the past.  We discussed that he would like to try  venlafaxine extended release 75 mg p.o. every morning.  I will increase to 150 mg in 1 month if necessary.  Reassess in 4 weeks.  Second we discussed weight loss.  In addition to his dietary changes I encouraged him to try to exercise much as possible.  We discussed Mancel Parsons and he will check with his insurance to see whether this is covered.  Third I will switch his injectable testosterone to testosterone 1.62% gel 2 pumps daily and recommended rechecking lab work in 3 months.  Also recommended offloading as much volunteer stress as he could possibly do.    11/20/22  Wt Readings from Last 3 Encounters:  11/20/22 295 lb (133.8 kg)  07/10/22 (!) 330 lb (149.7 kg)  07/03/22 (!) 330 lb (149.7 kg)   Since I last saw the patient, he has lost 35 pounds unintentionally.  He reports polyuria including urge incontinence.  He reports blurry vision.  He reports dry mouth.  He has a history of prediabetes.  He stopped taking the venlafaxine after 4 weeks because he felt that the medication was upsetting his stomach.  He saw no benefit from it.  He also reports fatigue and tiredness.  He is only taking the testosterone 2 days a week. Past Medical History:  Diagnosis Date   Allergy    seasonal   Anxiety    work related   Asthma  Erectile dysfunction    GERD (gastroesophageal reflux disease)    Heart murmur    Tobacco abuse    Past Surgical History:  Procedure Laterality Date   CARDIOVASCULAR STRESS TEST     cardiolite   LEFT HEART CATHETERIZATION WITH CORONARY ANGIOGRAM N/A 08/03/2014   Procedure: LEFT HEART CATHETERIZATION WITH CORONARY ANGIOGRAM;  Surgeon: Troy Sine, MD;  Location: [facility] CATH LAB;  Service: Cardiovascular;  Laterality: N/A;   US ECHOCARDIOGRAPHY  06/07/2004   Current Outpatient Medications on File Prior to Visit  Medication Sig Dispense Refill   albuterol (VENTOLIN HFA) 108 (90 Base) MCG/ACT inhaler Inhale 2 puffs into the lungs every 6 (six) hours as needed for wheezing or  shortness of breath. 18 g 3   aspirin EC 81 MG tablet Take 1 tablet (81 mg total) by mouth daily.     azithromycin (ZITHROMAX) 250 MG tablet 2 tabs poqday1, 1 tab poqday 2-5 6 tablet 0   Cetirizine HCl (ZYRTEC ALLERGY PO) Take 10 mg by mouth daily.     enalapril (VASOTEC) 20 MG tablet Take 1 tablet (20 mg total) by mouth daily. 90 tablet 3   predniSONE (DELTASONE) 20 MG tablet 3 tabs poqday 1-2, 2 tabs poqday 3-4, 1 tab poqday 5-6 12 tablet 0   Semaglutide-Weight Management (WEGOVY) 0.5 MG/0.5ML SOAJ Inject 0.5 mg into the skin once a week. Increase dose monthly as tolerated. 2 mL 1   sildenafil (VIAGRA) 100 MG tablet TAKE 1 TABLET BY MOUTH AS DIRECTED -Do NOT take with nitrates or within 4 hours of an alpha BLOCKER 24 tablet 2   SYRINGE-NEEDLE, DISP, 3 ML 25G X 1" 3 ML MISC 3 mLs by Does not apply route every morning. 5 each 4   Testosterone 1.62 % GEL APPLY EXTERNALLY 2 PUMPS ONTO SKIN DAILY 75 g 1   venlafaxine XR (EFFEXOR XR) 75 MG 24 hr capsule Take 1 capsule (75 mg total) by mouth daily with breakfast. 90 capsule 3   No current facility-administered medications on file prior to visit.   Allergies  Allergen Reactions   Codeine Other (See Comments)    Stomach upset   Social History   Socioeconomic History   Marital status: Married    Spouse name: Not on file   Number of children: Not on file   Years of education: Not on file   Highest education level: Not on file  Occupational History   Occupation: [employer], Secondary school teacher  Tobacco Use   Smoking status: Some Days    Packs/day: 0.50    Types: Cigarettes   Smokeless tobacco: Never  Substance and Sexual Activity   Alcohol use: Yes    Alcohol/week: 3.0 - 4.0 standard drinks of alcohol    Types: 2 Glasses of wine, 1 - 2 Standard drinks or equivalent per week   Drug use: No   Sexual activity: Yes    Birth control/protection: None  Other Topics Concern   Not on file  Social History Narrative   Not on file   Social  Determinants of Health   Financial Resource Strain: Not on file  Food Insecurity: Not on file  Transportation Needs: Not on file  Physical Activity: Not on file  Stress: Not on file  Social Connections: Not on file  Intimate Partner Violence: Not on file      Review of Systems  All other systems reviewed and are negative.      Objective:   Physical Exam Vitals reviewed.  Constitutional:  General: He is not in acute distress.    Appearance: Normal appearance. He is obese. He is not ill-appearing or toxic-appearing.  Cardiovascular:     Rate and Rhythm: Normal rate and regular rhythm.     Pulses: Normal pulses.     Heart sounds: Normal heart sounds. No murmur heard.    No friction rub. No gallop.  Pulmonary:     Effort: Pulmonary effort is normal.     Breath sounds: No wheezing, rhonchi or rales.  Neurological:     Mental Status: He is alert.           Assessment & Plan:  Hypogonadism in male - Plan: Urinalysis, Routine w reflex microscopic, CBC with Differential/Platelet, Lipid panel, COMPLETE METABOLIC PANEL WITH GFR, PSA, Testosterone Total,Free,Bio, Males  Elevated blood sugar - Plan: Hemoglobin A1c, CBC with Differential/Platelet, Lipid panel, COMPLETE METABOLIC PANEL WITH GFR, Testosterone Total,Free,Bio, Males  Prediabetes - Plan: Hemoglobin A1c, CBC with Differential/Platelet, Lipid panel, COMPLETE METABOLIC PANEL WITH GFR, Testosterone Total,Free,Bio, Males Based on his story I am very concerned that the patient has developed overt diabetes that is uncontrolled with hyperglycemia.  Check an A1c, CBC, CMP, lipid panel, testosterone level, and a PSA.  Uptitrate testosterone to daily use to try to help with his fatigue.  If his A1c and sugar are elevated I will likely start him on glipizide to get his sugars down quickly and transition the patient to metformin and Ozempic for long-term management.  We discussed this in detail

## 2022-11-21 ENCOUNTER — Other Ambulatory Visit: Payer: Self-pay

## 2022-11-21 DIAGNOSIS — E1169 Type 2 diabetes mellitus with other specified complication: Secondary | ICD-10-CM

## 2022-11-21 LAB — TESTOSTERONE TOTAL,FREE,BIO, MALES
Albumin: 4.4 g/dL (ref 3.6–5.1)
Sex Hormone Binding: 25 nmol/L (ref 22–77)
Testosterone: 159 ng/dL — ABNORMAL LOW (ref 250–827)

## 2022-11-21 LAB — COMPLETE METABOLIC PANEL WITH GFR
AG Ratio: 1.6 (calc) (ref 1.0–2.5)
ALT: 17 U/L (ref 9–46)
AST: 9 U/L — ABNORMAL LOW (ref 10–35)
Albumin: 4.4 g/dL (ref 3.6–5.1)
Alkaline phosphatase (APISO): 121 U/L (ref 35–144)
BUN: 15 mg/dL (ref 7–25)
CO2: 22 mmol/L (ref 20–32)
Calcium: 9.3 mg/dL (ref 8.6–10.3)
Chloride: 98 mmol/L (ref 98–110)
Creat: 0.82 mg/dL (ref 0.70–1.30)
Globulin: 2.8 g/dL (calc) (ref 1.9–3.7)
Glucose, Bld: 346 mg/dL — ABNORMAL HIGH (ref 65–99)
Potassium: 4.1 mmol/L (ref 3.5–5.3)
Sodium: 134 mmol/L — ABNORMAL LOW (ref 135–146)
Total Bilirubin: 0.5 mg/dL (ref 0.2–1.2)
Total Protein: 7.2 g/dL (ref 6.1–8.1)
eGFR: 102 mL/min/{1.73_m2} (ref >=60)

## 2022-11-21 LAB — CBC WITH DIFFERENTIAL/PLATELET
Absolute Monocytes: 342 cells/uL (ref 200–950)
Basophils Absolute: 63 cells/uL (ref 0–200)
Basophils Relative: 1.1 %
Eosinophils Absolute: 120 cells/uL (ref 15–500)
Eosinophils Relative: 2.1 %
HCT: 49.8 % (ref 38.5–50.0)
Hemoglobin: 17 g/dL (ref 13.2–17.1)
Lymphs Abs: 1488 cells/uL (ref 850–3900)
MCH: 31.5 pg (ref 27.0–33.0)
MCHC: 34.1 g/dL (ref 32.0–36.0)
MCV: 92.2 fL (ref 80.0–100.0)
MPV: 10 fL (ref 7.5–12.5)
Monocytes Relative: 6 %
Neutro Abs: 3688 cells/uL (ref 1500–7800)
Neutrophils Relative %: 64.7 %
Platelets: 216 10*3/uL (ref 140–400)
RBC: 5.4 10*6/uL (ref 4.20–5.80)
RDW: 12.3 % (ref 11.0–15.0)
Total Lymphocyte: 26.1 %
WBC: 5.7 10*3/uL (ref 3.8–10.8)

## 2022-11-21 LAB — PSA: PSA: 0.3 ng/mL (ref <=4.00)

## 2022-11-21 LAB — LIPID PANEL
Cholesterol: 148 mg/dL (ref <200)
HDL: 36 mg/dL — ABNORMAL LOW (ref >=40)
LDL Cholesterol (Calc): 78 mg/dL (calc)
Non-HDL Cholesterol (Calc): 112 mg/dL (calc) (ref <130)
Total CHOL/HDL Ratio: 4.1 (calc) (ref <5.0)
Triglycerides: 244 mg/dL — ABNORMAL HIGH (ref <150)

## 2022-11-21 LAB — HEMOGLOBIN A1C
Hgb A1c MFr Bld: 13.8 % of total Hgb — ABNORMAL HIGH (ref <5.7)
Mean Plasma Glucose: 349 mg/dL
eAG (mmol/L): 19.4 mmol/L

## 2022-11-21 MED ORDER — LANCET DEVICE MISC: 1.0000 | Freq: Three times a day (TID) | 0 refills | Status: AC

## 2022-11-21 MED ORDER — BLOOD GLUCOSE TEST VI STRP: 1.0000 | ORAL_STRIP | Freq: Three times a day (TID) | 0 refills | Status: DC

## 2022-11-21 MED ORDER — OZEMPIC (0.25 OR 0.5 MG/DOSE) 2 MG/3ML ~~LOC~~ SOPN: 0.5000 mg | PEN_INJECTOR | SUBCUTANEOUS | 1 refills | Status: DC

## 2022-11-21 MED ORDER — BLOOD GLUCOSE MONITORING SUPPL DEVI: 1.0000 | Freq: Three times a day (TID) | 0 refills | Status: AC

## 2022-11-21 MED ORDER — LANCETS MISC. MISC: 1.0000 | Freq: Three times a day (TID) | 0 refills | Status: AC

## 2022-12-04 ENCOUNTER — Ambulatory Visit: Payer: Self-pay

## 2022-12-04 ENCOUNTER — Telehealth: Payer: Self-pay | Admitting: Family Medicine

## 2022-12-04 NOTE — Telephone Encounter (Signed)
  Chief Complaint: blurred vision  Symptoms: blurred vision with or without glasses, HA  Frequency: 2-3 weeks since medications for DM started Pertinent Negatives: Patient denies eye pain, floaters, or HA r/t blurred vision Disposition: [] ED /[] Urgent Care (no appt availability in office) / [] Appointment(In office/virtual)/ []  [place] [facility]/ [] Home Care/ [] Refused Recommended Disposition /[] [place] [facility]/ []  Follow-up with PCP Additional Notes: pt states he started glipizide and Ozempic for DM and 1st week blurred vision was ok, but has gotten worse since then. Has appt with Eye Dr on 12/07/22 at 0800. Advised pt to go to that appt, no appts with PCP until that date as well so recommended if they suggest FU with PCP can call back to schedule appt. Pt states that blurred vision is same with or without glasses and can see "ok" when he looks down at bottom of glasses where he can progressive lenses. Advised pt if he develops eye pain or sees floaters to go to UC to be seen. Pt verbalized understanding.   Reason for Disposition  [1] Blurred vision or visual changes AND [2] gradual onset (e.g., weeks, months)  Answer Assessment - Initial Assessment Questions 1. DESCRIPTION: "How has your vision changed?" (e.g., complete vision loss, blurred vision, double vision, floaters, etc.)     Blurred vision 2. LOCATION: "One or both eyes?" If one, ask: "Which eye?"     Both eyes 3. SEVERITY: "Can you see anything?" If Yes, ask: "What can you see?" (e.g., fine print)     Can't read anything right now  4. ONSET: "When did this begin?" "Did it start suddenly or has this been gradual?"     2-3 weeks  5. PATTERN: "Does this come and go, or has it been constant since it started?"     constant 6. PAIN: "Is there any pain in your eye(s)?"  (Scale 1-10; or mild, moderate, severe)   - NONE (0): No pain.   - MILD (1-3): Doesn't interfere with normal activities.   - MODERATE (4-7): Interferes with  normal activities or awakens from sleep.    - SEVERE (8-10): Excruciating pain, unable to do any normal activities.     0 7. CONTACTS-GLASSES: "Do you wear contacts or glasses?"     Reading  8. CAUSE: "What do you think is causing this visual problem?"     Medication related  9. OTHER SYMPTOMS: "Do you have any other symptoms?" (e.g., confusion, headache, arm or leg weakness, speech problems)     HA  Protocols used: Vision Loss or Change-A-AH

## 2022-12-04 NOTE — Telephone Encounter (Signed)
Patient called to report changes in his vision due to his medications; stated everything is blurry even using his prescription glasses. Requesting a call back to discuss with the nurse. Appointment scheduled with Dr. Dennard Schaumann on 12/22/22.   Call transferred to triage nurse.

## 2022-12-13 ENCOUNTER — Telehealth: Payer: Self-pay

## 2022-12-13 DIAGNOSIS — E1165 Type 2 diabetes mellitus with hyperglycemia: Secondary | ICD-10-CM | POA: Insufficient documentation

## 2022-12-13 NOTE — Telephone Encounter (Signed)
PA for Ozempic sent to plan via Cover My Meds.   Information regarding your request Message from [employer]: Drug is not covered by plan.  Thank you.

## 2022-12-18 ENCOUNTER — Telehealth: Payer: Self-pay | Admitting: Family Medicine

## 2022-12-19 ENCOUNTER — Other Ambulatory Visit: Payer: Self-pay | Admitting: Family Medicine

## 2022-12-19 DIAGNOSIS — E1169 Type 2 diabetes mellitus with other specified complication: Secondary | ICD-10-CM

## 2022-12-22 ENCOUNTER — Ambulatory Visit: Payer: Managed Care, Other (non HMO) | Admitting: Family Medicine

## 2022-12-22 ENCOUNTER — Encounter: Payer: Self-pay | Admitting: Family Medicine

## 2022-12-22 VITALS — BP 128/64 | HR 76 | Ht 72.0 in | Wt 297.0 lb

## 2022-12-22 DIAGNOSIS — E1169 Type 2 diabetes mellitus with other specified complication: Secondary | ICD-10-CM | POA: Diagnosis not present

## 2022-12-22 MED ORDER — SEMAGLUTIDE (1 MG/DOSE) 4 MG/3ML ~~LOC~~ SOPN: 1.0000 mg | PEN_INJECTOR | SUBCUTANEOUS | 5 refills | Status: DC

## 2022-12-22 NOTE — Progress Notes (Signed)
Subjective:    Patient ID: George Brock, male    DOB: 08-20-1966, 57 y.o.   MRN: [id number] 07/03/22 Patient is a 57 year old Caucasian gentleman who presents today originally complaining of right knee pain requesting a cortisone injection.  However, I feel that he mainly just wanted to talk.  He is undergoing a tremendous amount of stress at the present time.  His father has been diagnosed with cancer.  His father-in-law has been diagnosed with stage IV lung cancer.  He just lost his aunt to cancer.  Simultaneously, his wife is battling ataxia and urinary incontinence.  She has been evaluated by neurology and they are concerned that she may have multiple sclerosis.  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  He reports feeling depressed and overwhelmed.  He is also struggling with obesity.  I am proud of the patient because he quit smoking.  However he has gained weight.  He states he cannot exercise due to the knee pain.  Furthermore he just feels tired and has no energy.  He is trying to diet and eat healthy however this has not been successful for him.  His last lab work in April showed prediabetes.  He also has testosterone deficiency however his wife has gotten so weak that she is no longer able to perform the injections and he is unable to give himself shots.  He is interested in switching to topical testosterone treatment.  His BMI is 45.  At that time, my plan was: Honestly, very little exam was performed today.  The majority of our time was spent talking.  I offered to give the patient a cortisone injection in his knee however he politely declined it.  I truly feel that he just wanted to talk and offload some of the stress that he feels.  We discussed treatments for depression.  He has tried Lexapro and Wellbutrin in the past.  We discussed that he would like to try  venlafaxine extended release 75 mg p.o. every morning.  I will increase to 150 mg in 1 month if necessary.  Reassess in 4 weeks.  Second we discussed weight loss.  In addition to his dietary changes I encouraged him to try to exercise much as possible.  We discussed Reginal Lutes and he will check with his insurance to see whether this is covered.  Third I will switch his injectable testosterone to testosterone 1.62% gel 2 pumps daily and recommended rechecking lab work in 3 months.  Also recommended offloading as much volunteer stress as he could possibly do.    11/20/22   Since I last saw the patient, he has lost 35 pounds unintentionally.  He reports polyuria including urge incontinence.  He reports blurry vision.  He reports dry mouth.  He has a history of prediabetes.  He stopped taking the venlafaxine after 4 weeks because he felt that the medication was upsetting his stomach.  He saw no benefit from it.  He also reports fatigue and tiredness.  He is only taking the testosterone 2 days a week.  At that time, my plan was: Based on his story I am very concerned that the patient has developed overt diabetes that is uncontrolled with hyperglycemia.  Check an A1c, CBC, CMP, lipid panel, testosterone level, and a PSA.  Uptitrate testosterone to daily use to try to help with his fatigue.  If his A1c and sugar are elevated I will likely start him on glipizide to get his sugars down quickly and transition the patient to metformin and Ozempic for long-term management.  We discussed this in detail  12/22/22 Patient started glipizide along with Ozempic.  His A1c was greater than 13.  He is uptitrated to 0.5 mg of Ozempic.  He denies any nausea or abdominal pain.  Over the last month, his blood sugars have done extremely well.  FBS are 90 and 120.  He is having no episodes of hypoglycemia.  2-hour postprandial sugars are under 150!  He has drastically changed his diet Past Medical History:  Diagnosis Date   Allergy     seasonal   Anxiety    work related   Asthma    Erectile dysfunction    GERD (gastroesophageal reflux disease)    Heart murmur    Tobacco abuse    Past Surgical History:  Procedure Laterality Date   CARDIOVASCULAR STRESS TEST     cardiolite   LEFT HEART CATHETERIZATION WITH CORONARY ANGIOGRAM N/A 08/03/2014   Procedure: LEFT HEART CATHETERIZATION WITH CORONARY ANGIOGRAM;  Surgeon: Lennette Biharihomas A Kelly, MD;  [facility];  Service: Cardiovascular;  Laterality: N/A;   US ECHOCARDIOGRAPHY  06/07/2004   Current Outpatient Medications on File Prior to Visit  Medication Sig Dispense Refill   Accu-Chek FastClix Lancets MISC USE TO CHECK GLUCOSE THREE TIMES DAILY.(MORNING NOON AND BEDTIME) 102 each 1   albuterol (VENTOLIN HFA) 108 (90 Base) MCG/ACT inhaler Inhale 2 puffs into the lungs every 6 (six) hours as needed for wheezing or shortness of breath. 18 g 3   aspirin EC 81 MG tablet Take 1 tablet (81 mg total) by mouth daily.     Blood Glucose Monitoring Suppl (ACCU-CHEK GUIDE) w/Device KIT      Blood Glucose Monitoring Suppl DEVI 1 each by Does not apply route in the morning, at noon, and at bedtime. May substitute to any manufacturer covered by patient's insurance. 1 each 0   Cetirizine HCl (ZYRTEC ALLERGY PO) Take 10 mg by mouth daily.     enalapril (VASOTEC) 20 MG tablet Take 1 tablet (20 mg total) by mouth daily. 90 tablet 3   glipiZIDE (GLIPIZIDE XL) 5 MG 24 hr tablet Take 1 tablet (5 mg total) by mouth daily with breakfast. 30 tablet 5   glucose blood (ACCU-CHEK GUIDE) test strip USE TO CHECK GLUCOSE THREE TIMES DAILY.(MORNING NOON AND BEDTIME) 100 strip 5   sildenafil (VIAGRA) 100 MG tablet TAKE 1 TABLET BY MOUTH AS DIRECTED -Do NOT take with nitrates or within 4 hours of an alpha BLOCKER 24 tablet 2   Testosterone 1.62 % GEL APPLY EXTERNALLY 2 PUMPS ONTO SKIN DAILY 75 g 1   No current facility-administered medications on file prior to visit.   Allergies  Allergen Reactions    Codeine Other (See Comments)    Stomach upset   Social History   Socioeconomic History   Marital status: Married    Spouse name: Not on file   Number of children: Not on file   Years of education: Not on file   Highest education level: Not on file  Occupational History   Occupation: [employer], Technical sales engineeracilities Manager  Tobacco Use   Smoking status: Some Days    Packs/day: .5    Types: Cigarettes   Smokeless tobacco: Never  Substance and Sexual Activity   Alcohol use: Yes    Alcohol/week: 3.0 - 4.0 standard drinks  of alcohol    Types: 2 Glasses of wine, 1 - 2 Standard drinks or equivalent per week   Drug use: No   Sexual activity: Yes    Birth control/protection: None  Other Topics Concern   Not on file  Social History Narrative   Not on file   Social Determinants of Health   Financial Resource Strain: Not on file  Food Insecurity: Not on file  Transportation Needs: Not on file  Physical Activity: Not on file  Stress: Not on file  Social Connections: Not on file  Intimate Partner Violence: Not on file      Review of Systems  All other systems reviewed and are negative.      Objective:   Physical Exam Vitals reviewed.  Constitutional:      General: He is not in acute distress.    Appearance: Normal appearance. He is obese. He is not ill-appearing or toxic-appearing.  Cardiovascular:     Rate and Rhythm: Normal rate and regular rhythm.     Pulses: Normal pulses.     Heart sounds: Normal heart sounds. No murmur heard.    No friction rub. No gallop.  Pulmonary:     Effort: Pulmonary effort is normal.     Breath sounds: No wheezing, rhonchi or rales.  Neurological:     Mental Status: He is alert.           Assessment & Plan:  Type 2 diabetes mellitus with other specified complication, without long-term current use of insulin Blood sugars are now outstanding.  Recommended uptitrating Ozempic to 1 mg subcu weekly and discontinuing glipizide.  Recheck  hemoglobin A1c in 2 months.  Monitor blood sugars closely.  If they get out of control we may need to resume glipizide or consider adding metformin

## 2022-12-27 NOTE — Telephone Encounter (Signed)
CoverMyMeds Recommendation  This medication may be excluded from the patient's benefit.

## 2023-01-07 ENCOUNTER — Other Ambulatory Visit: Payer: Self-pay | Admitting: Family Medicine

## 2023-01-07 DIAGNOSIS — E1169 Type 2 diabetes mellitus with other specified complication: Secondary | ICD-10-CM

## 2023-02-23 ENCOUNTER — Ambulatory Visit: Payer: Managed Care, Other (non HMO) | Admitting: Family Medicine

## 2023-02-26 ENCOUNTER — Ambulatory Visit: Payer: Managed Care, Other (non HMO) | Admitting: Family Medicine

## 2023-02-26 ENCOUNTER — Encounter: Payer: Self-pay | Admitting: Family Medicine

## 2023-02-26 VITALS — BP 132/84 | HR 87 | Ht 72.0 in | Wt 291.4 lb

## 2023-02-26 DIAGNOSIS — Z7985 Long-term (current) use of injectable non-insulin antidiabetic drugs: Secondary | ICD-10-CM | POA: Diagnosis not present

## 2023-02-26 DIAGNOSIS — E1169 Type 2 diabetes mellitus with other specified complication: Secondary | ICD-10-CM

## 2023-02-26 LAB — CBC WITH DIFFERENTIAL/PLATELET
Absolute Monocytes: 610 cells/uL (ref 200–950)
Basophils Relative: 0.4 %
Eosinophils Absolute: 190 cells/uL (ref 15–500)
MCV: 92.7 fL (ref 80.0–100.0)
Monocytes Relative: 6.1 %
Platelets: 262 10*3/uL (ref 140–400)
Total Lymphocyte: 20.8 %

## 2023-02-26 MED ORDER — EMPAGLIFLOZIN 25 MG PO TABS
25.0000 mg | ORAL_TABLET | Freq: Every day | ORAL | 11 refills | Status: DC
Start: 2023-02-26 — End: 2024-03-24

## 2023-02-26 NOTE — Progress Notes (Signed)
Subjective:    Patient ID: George Brock, male    DOB: May 05, 1966, 57 y.o.   MRN: [id number] In April, I started the patient on glipizide due to poorly controlled blood sugars as well as Ozempic.  Once his blood sugars were under control, I discontinued the glipizide and uptitrating the Ozempic to 1 mg subcu weekly.  However the patient is doing poorly on the Ozempic.  He is extremely nauseated on a daily basis.  In fact he states that he vomits every day after he takes a shot of Ozempic.  He does not want to continue the medication.  His blood sugars however are outstanding.  His fasting blood sugars are typically between 90 and 120.  The vast majority are between 100-110.  He denies any hypoglycemic episodes.  His biggest concern is the nausea.  We discussed switching to metformin but he does not want to take metformin due to thanks his friends have told him Past Medical History:  Diagnosis Date   Allergy    seasonal   Anxiety    work related   Asthma    Erectile dysfunction    GERD (gastroesophageal reflux disease)    Heart murmur    Tobacco abuse    Past Surgical History:  Procedure Laterality Date   CARDIOVASCULAR STRESS TEST     cardiolite   LEFT HEART CATHETERIZATION WITH CORONARY ANGIOGRAM N/A 08/03/2014   Procedure: LEFT HEART CATHETERIZATION WITH CORONARY ANGIOGRAM;  Surgeon: Lennette Bihari, MD;  Location: [facility];  Service: Cardiovascular;  Laterality: N/A;   US ECHOCARDIOGRAPHY  06/07/2004   Current Outpatient Medications on File Prior to Visit  Medication Sig Dispense Refill   Accu-Chek FastClix Lancets MISC USE TO CHECK GLUCOSE THREE TIMES DAILY.(MORNING NOON AND BEDTIME) 102 each 1   albuterol (VENTOLIN HFA) 108 (90 Base) MCG/ACT inhaler Inhale 2 puffs into the lungs every 6 (six) hours as needed for wheezing or shortness of breath. 18 g 3   aspirin EC 81 MG tablet Take 1 tablet (81 mg total) by mouth daily.     Blood Glucose Monitoring Suppl (ACCU-CHEK GUIDE)  w/Device KIT      Blood Glucose Monitoring Suppl DEVI 1 each by Does not apply route in the morning, at noon, and at bedtime. May substitute to any manufacturer covered by patient's insurance. 1 each 0   Cetirizine HCl (ZYRTEC ALLERGY PO) Take 10 mg by mouth daily.     enalapril (VASOTEC) 20 MG tablet Take 1 tablet (20 mg total) by mouth daily. 90 tablet 3   glucose blood (ACCU-CHEK GUIDE) test strip USE TO CHECK GLUCOSE THREE TIMES DAILY.(MORNING NOON AND BEDTIME) 100 strip 5   Semaglutide, 1 MG/DOSE, 4 MG/3ML SOPN Inject 1 mg as directed once a week. 3 mL 5   sildenafil (VIAGRA) 100 MG tablet TAKE 1 TABLET BY MOUTH AS DIRECTED -Do NOT take with nitrates or within 4 hours of an alpha BLOCKER 24 tablet 2   Testosterone 1.62 % GEL APPLY EXTERNALLY 2 PUMPS ONTO SKIN DAILY 75 g 1   No current facility-administered medications on file prior to visit.   Allergies  Allergen Reactions   Codeine Other (See Comments)    Stomach upset   Social History   Socioeconomic History   Marital status: Married    Spouse name: Not on file   Number of children: Not on file   Years of education: Not on file   Highest education level: Not on file  Occupational  History   Occupation: [employer] cabin crew, Technical sales engineer  Tobacco Use   Smoking status: Some Days    Packs/day: .5    Types: Cigarettes   Smokeless tobacco: Never  Substance and Sexual Activity   Alcohol use: Yes    Alcohol/week: 3.0 - 4.0 standard drinks of alcohol    Types: 2 Glasses of wine, 1 - 2 Standard drinks or equivalent per week   Drug use: No   Sexual activity: Yes    Birth control/protection: None  Other Topics Concern   Not on file  Social History Narrative   Not on file   Social Determinants of Health   Financial Resource Strain: Not on file  Food Insecurity: Not on file  Transportation Needs: Not on file  Physical Activity: Not on file  Stress: Not on file  Social Connections: Not on file  Intimate Partner Violence: Not  on file      Review of Systems  All other systems reviewed and are negative.      Objective:   Physical Exam Vitals reviewed.  Constitutional:      General: He is not in acute distress.    Appearance: Normal appearance. He is obese. He is not ill-appearing or toxic-appearing.  Cardiovascular:     Rate and Rhythm: Normal rate and regular rhythm.     Pulses: Normal pulses.     Heart sounds: Normal heart sounds. No murmur heard.    No friction rub. No gallop.  Pulmonary:     Effort: Pulmonary effort is normal.     Breath sounds: No wheezing, rhonchi or rales.  Neurological:     Mental Status: He is alert.           Assessment & Plan:  Type 2 diabetes mellitus with other specified complication, without long-term current use of insulin (HCC) - Plan: Hemoglobin A1c, COMPLETE METABOLIC PANEL WITH GFR, CBC with Differential/Platelet Blood sugars are outstanding but the patient is failing to tolerate Ozempic.  Discontinue Ozempic and replace with Jardiance 25 mg daily.  Patient declines taking metformin.  I want to avoid glipizide if possible.  If he cannot tolerate Jardiance the next option would be Actos.

## 2023-02-27 LAB — CBC WITH DIFFERENTIAL/PLATELET
Basophils Absolute: 40 cells/uL (ref 0–200)
Eosinophils Relative: 1.9 %
HCT: 45.4 % (ref 38.5–50.0)
Hemoglobin: 15.5 g/dL (ref 13.2–17.1)
Lymphs Abs: 2080 cells/uL (ref 850–3900)
MCH: 31.6 pg (ref 27.0–33.0)
MCHC: 34.1 g/dL (ref 32.0–36.0)
MPV: 9.2 fL (ref 7.5–12.5)
Neutro Abs: 7080 cells/uL (ref 1500–7800)
Neutrophils Relative %: 70.8 %
RBC: 4.9 10*6/uL (ref 4.20–5.80)
RDW: 11.9 % (ref 11.0–15.0)
WBC: 10 10*3/uL (ref 3.8–10.8)

## 2023-02-27 LAB — COMPLETE METABOLIC PANEL WITH GFR
AG Ratio: 1.6 (calc) (ref 1.0–2.5)
ALT: 16 U/L (ref 9–46)
AST: 12 U/L (ref 10–35)
Albumin: 4.4 g/dL (ref 3.6–5.1)
Alkaline phosphatase (APISO): 68 U/L (ref 35–144)
BUN: 21 mg/dL (ref 7–25)
CO2: 25 mmol/L (ref 20–32)
Calcium: 9.8 mg/dL (ref 8.6–10.3)
Chloride: 103 mmol/L (ref 98–110)
Creat: 0.96 mg/dL (ref 0.70–1.30)
Globulin: 2.8 g/dL (calc) (ref 1.9–3.7)
Glucose, Bld: 94 mg/dL (ref 65–99)
Potassium: 4.7 mmol/L (ref 3.5–5.3)
Sodium: 138 mmol/L (ref 135–146)
Total Bilirubin: 0.5 mg/dL (ref 0.2–1.2)
Total Protein: 7.2 g/dL (ref 6.1–8.1)
eGFR: 92 mL/min/{1.73_m2} (ref >=60)

## 2023-02-27 LAB — HEMOGLOBIN A1C
Hgb A1c MFr Bld: 5.3 % of total Hgb (ref <5.7)
Mean Plasma Glucose: 105 mg/dL
eAG (mmol/L): 5.8 mmol/L

## 2023-03-13 ENCOUNTER — Other Ambulatory Visit: Payer: Self-pay

## 2023-03-13 DIAGNOSIS — I2510 Atherosclerotic heart disease of native coronary artery without angina pectoris: Secondary | ICD-10-CM

## 2023-03-13 DIAGNOSIS — E1169 Type 2 diabetes mellitus with other specified complication: Secondary | ICD-10-CM

## 2023-03-13 MED ORDER — METFORMIN HCL ER 500 MG PO TB24
1000.0000 mg | ORAL_TABLET | Freq: Every day | ORAL | 3 refills | Status: DC
Start: 2023-03-13 — End: 2023-11-19

## 2023-03-14 ENCOUNTER — Telehealth: Payer: Self-pay

## 2023-03-14 NOTE — Telephone Encounter (Signed)
Updated my chart message from pt regarding Jardiance: MJ.Marland KitchenMarland KitchenI will go and pick up the scrip of Metforim and start on thar as soon as I finish the samples of Jardiance doc gave me. Let me see how it does and how my stomach handles it. If there is a issue then we will put our heads together and see what else is available. Thank you for your hard work.

## 2023-03-14 NOTE — Telephone Encounter (Signed)
Pt's insurance will not cover his Jardiance, even with a PA:  CoverMyMeds Recommendation This medication may be excluded from the patient's benefit. For more information, please reach out to [facility] directly at [phone].  Pt is aware. Is there an alternative? Thank you!

## 2023-07-28 ENCOUNTER — Other Ambulatory Visit: Payer: Self-pay | Admitting: Family Medicine

## 2023-07-28 DIAGNOSIS — I10 Essential (primary) hypertension: Secondary | ICD-10-CM

## 2023-07-30 NOTE — Telephone Encounter (Signed)
Requested by interface surescripts. Medication discontinued 11/20/22.  Requested Prescriptions  Refused Prescriptions Disp Refills   venlafaxine XR (EFFEXOR-XR) 75 MG 24 hr capsule [Pharmacy Med Name: VENLAFAXINE ER 75MG  CAPSULES] 90 capsule 3    Sig: TAKE 1 CAPSULE(75 MG) BY MOUTH DAILY WITH BREAKFAST     Psychiatry: Antidepressants - SNRI - desvenlafaxine & venlafaxine Failed - 07/28/2023  3:31 AM      Failed - Valid encounter within last 6 months    Recent Outpatient Visits           1 year ago Prostate cancer screening   [facility] Donita Brooks, MD   1 year ago Spider bite wound, undetermined intent, initial encounter   [facility] Donita Brooks, MD   2 years ago Acute rhinosinusitis   [facility] Donita Brooks, MD   3 years ago Hypogonadism in male   [facility] Pickard, Priscille Heidelberg, MD   3 years ago Encounter for preventive health examination   [facility] Donita Brooks, MD              Failed - Lipid Panel in normal range within the last 12 months    Cholesterol  Date Value Ref Range Status  11/20/2022 148 <200 mg/dL Final   LDL Cholesterol (Calc)  Date Value Ref Range Status  11/20/2022 78 mg/dL (calc) Final    Comment:    Reference range: <100 . Desirable range <100 mg/dL for primary prevention;   <70 mg/dL for patients with CHD or diabetic patients  with > or = 2 CHD risk factors. Marland Kitchen LDL-C is now calculated using the Martin-Hopkins  calculation, which is a validated novel method providing  better accuracy than the Friedewald equation in the  estimation of LDL-C.  Horald Pollen et al. Lenox Ahr. 5284;132(44): 2061-2068  (http://education.[facility].com/faq/FAQ164)    HDL  Date Value Ref Range Status  11/20/2022 36 (L) > OR = 40 mg/dL Final   Triglycerides  Date Value Ref Range Status  11/20/2022 244 (H) <150 mg/dL Final    Comment:    . If a non-fasting  specimen was collected, consider repeat triglyceride testing on a fasting specimen if clinically indicated.  Perry Mount et al. J. of Clin. Lipidol. 2015;9:129-169. Marland Kitchen          Passed - Cr in normal range and within 360 days    Creat  Date Value Ref Range Status  02/26/2023 0.96 0.70 - 1.30 mg/dL Final         Passed - Last BP in normal range    BP Readings from Last 1 Encounters:  02/26/23 132/84

## 2023-10-11 ENCOUNTER — Encounter: Payer: Self-pay | Admitting: Family Medicine

## 2023-10-11 ENCOUNTER — Telehealth: Payer: Self-pay

## 2023-10-11 NOTE — Addendum Note (Signed)
Addended by: Darral Dash on: 10/11/2023 04:46 PM   Modules accepted: Orders

## 2023-10-11 NOTE — Telephone Encounter (Signed)
Copied from CRM [id number]. Topic: General - Other >> Oct 11, 2023  4:27 PM Geneva B wrote: Reason for CRM: patient is calling in because he wants the information saying that he smokes sometimes on his account deleted and updated because patient says that he stop smoking 3 years ago and it is effecting the cost of his life insurance please [phone]

## 2023-10-12 NOTE — Telephone Encounter (Unsigned)
Copied from CRM [id number]. Topic: Medical Record Request - Records Request >> Oct 12, 2023 11:08 AM Gildardo Pounds wrote: Reason for CRM: Jonny Ruiz with [employer] inquiring about medical records for patient. Callback number is [phone]

## 2023-11-19 ENCOUNTER — Ambulatory Visit (INDEPENDENT_AMBULATORY_CARE_PROVIDER_SITE_OTHER): Payer: Managed Care, Other (non HMO) | Admitting: Family Medicine

## 2023-11-19 ENCOUNTER — Encounter: Payer: Self-pay | Admitting: Family Medicine

## 2023-11-19 VITALS — BP 124/82 | HR 64 | Temp 97.7°F | Ht 72.0 in | Wt 300.4 lb

## 2023-11-19 DIAGNOSIS — Z0000 Encounter for general adult medical examination without abnormal findings: Secondary | ICD-10-CM

## 2023-11-19 DIAGNOSIS — Z125 Encounter for screening for malignant neoplasm of prostate: Secondary | ICD-10-CM

## 2023-11-19 DIAGNOSIS — E291 Testicular hypofunction: Secondary | ICD-10-CM | POA: Diagnosis not present

## 2023-11-19 DIAGNOSIS — E1169 Type 2 diabetes mellitus with other specified complication: Secondary | ICD-10-CM

## 2023-11-19 DIAGNOSIS — Z0001 Encounter for general adult medical examination with abnormal findings: Secondary | ICD-10-CM | POA: Diagnosis not present

## 2023-11-19 DIAGNOSIS — Z1211 Encounter for screening for malignant neoplasm of colon: Secondary | ICD-10-CM

## 2023-11-19 DIAGNOSIS — Z7984 Long term (current) use of oral hypoglycemic drugs: Secondary | ICD-10-CM

## 2023-11-19 NOTE — Progress Notes (Signed)
 Subjective:    Patient ID: George Brock, male    DOB: [birth year], 58 y.o.   MRN: [id number]  HPI Wt Readings from Last 3 Encounters:  11/19/23 (!) 300 lb 6.4 oz (136.3 kg)  02/26/23 291 lb 6.4 oz (132.2 kg)  12/22/22 297 lb (134.7 kg)    Patient is a very pleasant 58 year old Caucasian gentleman who presents today for complete physical exam.  He has type 2 diabetes mellitus, hypogonadism, and a history of tobacco abuse.  Patient has stopped taking GLP-1 as well as metformin.  He is still on Jardiance.  He states that his fasting blood sugars under 130.  Patient has quit smoking now for 3 years.  He is still due for lung cancer screening.  He is also overdue for a colonoscopy and prostate cancer screening.  He discontinued testosterone.  Blood pressure today is acceptable at 124/82. Past Medical History:  Diagnosis Date   Allergy    seasonal   Anxiety    work related   Asthma    Erectile dysfunction    GERD (gastroesophageal reflux disease)    Heart murmur    Tobacco abuse    Past Surgical History:  Procedure Laterality Date   CARDIOVASCULAR STRESS TEST     cardiolite   LEFT HEART CATHETERIZATION WITH CORONARY ANGIOGRAM N/A 08/03/2014   Procedure: LEFT HEART CATHETERIZATION WITH CORONARY ANGIOGRAM;  Surgeon: Lennette Bihari, MD;  Location: [facility] CATH LAB;  Service: Cardiovascular;  Laterality: N/A;   US ECHOCARDIOGRAPHY  06/07/2004   Current Outpatient Medications on File Prior to Visit  Medication Sig Dispense Refill   albuterol (VENTOLIN HFA) 108 (90 Base) MCG/ACT inhaler Inhale 2 puffs into the lungs every 6 (six) hours as needed for wheezing or shortness of breath. 18 g 3   aspirin EC 81 MG tablet Take 1 tablet (81 mg total) by mouth daily.     Blood Glucose Monitoring Suppl (ACCU-CHEK GUIDE) w/Device KIT      Blood Glucose Monitoring Suppl DEVI 1 each by Does not apply route in the morning, at noon, and at bedtime. May substitute to any manufacturer covered by patient's  insurance. 1 each 0   Cetirizine HCl (ZYRTEC ALLERGY PO) Take 10 mg by mouth daily.     empagliflozin (JARDIANCE) 25 MG TABS tablet Take 1 tablet (25 mg total) by mouth daily before breakfast. 30 tablet 11   enalapril (VASOTEC) 20 MG tablet TAKE 1 TABLET(20 MG) BY MOUTH DAILY 90 tablet 3   glucose blood (ACCU-CHEK GUIDE) test strip USE TO CHECK GLUCOSE THREE TIMES DAILY.(MORNING NOON AND BEDTIME) 100 strip 5   venlafaxine (EFFEXOR) 75 MG tablet Take 75 mg by mouth 2 (two) times daily with a meal.     Testosterone 1.62 % GEL APPLY EXTERNALLY 2 PUMPS ONTO SKIN DAILY (Patient not taking: Reported on 11/19/2023) 75 g 1   No current facility-administered medications on file prior to visit.     Allergies  Allergen Reactions   Codeine Other (See Comments)    Stomach upset   Social History   Socioeconomic History   Marital status: Married    Spouse name: Not on file   Number of children: Not on file   Years of education: Not on file   Highest education level: Not on file  Occupational History   Occupation: [employer], Technical sales engineer  Tobacco Use   Smoking status: Former    Types: Cigarettes   Smokeless tobacco: Never  Substance and Sexual Activity  Alcohol use: Yes    Alcohol/week: 3.0 - 4.0 standard drinks of alcohol    Types: 2 Glasses of wine, 1 - 2 Standard drinks or equivalent per week   Drug use: No   Sexual activity: Yes    Birth control/protection: None  Other Topics Concern   Not on file  Social History Narrative   Not on file   Social Drivers of Health   Financial Resource Strain: Not on file  Food Insecurity: Not on file  Transportation Needs: Not on file  Physical Activity: Not on file  Stress: Not on file  Social Connections: Not on file  Intimate Partner Violence: Not on file   Family History  Problem Relation Age of Onset   CAD Mother    Diabetes Mother    Heart disease Mother    Cancer Father    Hypertension Father       Review of  Systems     Objective:   Physical Exam Vitals reviewed.  Constitutional:      General: He is not in acute distress.    Appearance: He is obese. He is not ill-appearing, toxic-appearing or diaphoretic.  HENT:     Head: Normocephalic and atraumatic.     Right Ear: Tympanic membrane and ear canal normal. There is no impacted cerumen.     Left Ear: Tympanic membrane and ear canal normal. There is no impacted cerumen.     Nose: Nose normal. No congestion or rhinorrhea.     Mouth/Throat:     Mouth: Mucous membranes are moist.     Pharynx: No oropharyngeal exudate or posterior oropharyngeal erythema.  Eyes:     General: No scleral icterus.       Right eye: No discharge.        Left eye: No discharge.     Extraocular Movements: Extraocular movements intact.     Conjunctiva/sclera: Conjunctivae normal.     Pupils: Pupils are equal, round, and reactive to light.  Neck:     Vascular: No carotid bruit.  Cardiovascular:     Rate and Rhythm: Normal rate and regular rhythm.     Pulses: Normal pulses.     Heart sounds: Normal heart sounds. No murmur heard.    No friction rub. No gallop.  Pulmonary:     Effort: Pulmonary effort is normal. No respiratory distress.     Breath sounds: Normal breath sounds. No stridor. No wheezing, rhonchi or rales.  Chest:     Chest wall: No tenderness.  Abdominal:     General: Bowel sounds are normal. There is no distension.     Palpations: Abdomen is soft. There is no mass.     Tenderness: There is no abdominal tenderness. There is no guarding or rebound.     Hernia: No hernia is present.  Musculoskeletal:     Cervical back: Neck supple.     Right lower leg: No edema.     Left lower leg: No edema.  Skin:    General: Skin is warm.     Coloration: Skin is not jaundiced or pale.     Findings: No bruising, erythema, lesion or rash.  Neurological:     General: No focal deficit present.     Mental Status: He is alert and oriented to person, place, and  time. Mental status is at baseline.     Cranial Nerves: No cranial nerve deficit.     Sensory: No sensory deficit.     Motor: No weakness.  Coordination: Coordination normal.     Gait: Gait normal.     Deep Tendon Reflexes: Reflexes normal.  Psychiatric:        Mood and Affect: Mood normal.        Behavior: Behavior normal.        Thought Content: Thought content normal.        Judgment: Judgment normal.          Assessment & Plan:  General medical exam - Plan: Lipid panel, COMPLETE METABOLIC PANEL WITH GFR, CBC with Differential/Platelet, PSA, Hemoglobin A1c, Microalbumin/Creatinine Ratio, Urine, Ambulatory referral to Gastroenterology, Testosterone Total,Free,Bio, Males  Type 2 diabetes mellitus with other specified complication, without long-term current use of insulin (HCC) - Plan: Lipid panel, COMPLETE METABOLIC PANEL WITH GFR, CBC with Differential/Platelet, Hemoglobin A1c, Microalbumin/Creatinine Ratio, Urine, CT CARDIAC SCORING (SELF PAY ONLY)  Hypogonadism in male - Plan: COMPLETE METABOLIC PANEL WITH GFR, CBC with Differential/Platelet, Testosterone Total,Free,Bio, Males  Morbid obesity (HCC)  Prostate cancer screening - Plan: Hemoglobin A1c  Colon cancer screening - Plan: Ambulatory referral to Gastroenterology My biggest concern is the patient's weight.  Patient is due for colonoscopy.  I will schedule that.  I will schedule a PSA to screen for prostate cancer.  He is not on a statin despite being a diabetic.  He states that his cholesterol has always been excellent.  Therefore I will obtain a coronary artery calcium score to risk stratify the patient more accurately to determine if he would benefit from a statin as I believe that he would.  I will check a urine protein creatinine ratio.  I will check a hemoglobin A1c.

## 2023-11-20 LAB — MICROALBUMIN / CREATININE URINE RATIO
Creatinine, Urine: 61 mg/dL (ref 20–320)
Microalb Creat Ratio: 5 mg/g{creat} (ref <30)
Microalb, Ur: 0.3 mg/dL

## 2023-11-20 LAB — COMPLETE METABOLIC PANEL WITH GFR
AG Ratio: 1.6 (calc) (ref 1.0–2.5)
ALT: 19 U/L (ref 9–46)
AST: 15 U/L (ref 10–35)
Albumin: 4.4 g/dL (ref 3.6–5.1)
Alkaline phosphatase (APISO): 72 U/L (ref 35–144)
BUN: 13 mg/dL (ref 7–25)
CO2: 25 mmol/L (ref 20–32)
Calcium: 9.6 mg/dL (ref 8.6–10.3)
Chloride: 102 mmol/L (ref 98–110)
Creat: 0.86 mg/dL (ref 0.70–1.30)
Globulin: 2.7 g/dL (ref 1.9–3.7)
Glucose, Bld: 110 mg/dL — ABNORMAL HIGH (ref 65–99)
Potassium: 4.7 mmol/L (ref 3.5–5.3)
Sodium: 137 mmol/L (ref 135–146)
Total Bilirubin: 0.4 mg/dL (ref 0.2–1.2)
Total Protein: 7.1 g/dL (ref 6.1–8.1)
eGFR: 100 mL/min/{1.73_m2} (ref >=60)

## 2023-11-20 LAB — PSA: PSA: 0.43 ng/mL (ref <=4.00)

## 2023-11-20 LAB — TESTOSTERONE TOTAL,FREE,BIO, MALES
Albumin: 4.4 g/dL (ref 3.6–5.1)
Sex Hormone Binding: 29 nmol/L (ref 22–77)
Testosterone, Bioavailable: 95.2 ng/dL — ABNORMAL LOW (ref 110.0–575.0)
Testosterone, Free: 47.3 pg/mL (ref 46.0–224.0)
Testosterone: 331 ng/dL (ref 250–827)

## 2023-11-20 LAB — CBC WITH DIFFERENTIAL/PLATELET
Absolute Lymphocytes: 1771 {cells}/uL (ref 850–3900)
Absolute Monocytes: 371 {cells}/uL (ref 200–950)
Basophils Absolute: 42 {cells}/uL (ref 0–200)
Basophils Relative: 0.6 %
Eosinophils Absolute: 189 {cells}/uL (ref 15–500)
Eosinophils Relative: 2.7 %
HCT: 51.5 % — ABNORMAL HIGH (ref 38.5–50.0)
Hemoglobin: 17.2 g/dL — ABNORMAL HIGH (ref 13.2–17.1)
MCH: 31.2 pg (ref 27.0–33.0)
MCHC: 33.4 g/dL (ref 32.0–36.0)
MCV: 93.5 fL (ref 80.0–100.0)
MPV: 9.2 fL (ref 7.5–12.5)
Monocytes Relative: 5.3 %
Neutro Abs: 4627 {cells}/uL (ref 1500–7800)
Neutrophils Relative %: 66.1 %
Platelets: 218 10*3/uL (ref 140–400)
RBC: 5.51 10*6/uL (ref 4.20–5.80)
RDW: 12.4 % (ref 11.0–15.0)
Total Lymphocyte: 25.3 %
WBC: 7 10*3/uL (ref 3.8–10.8)

## 2023-11-20 LAB — HEMOGLOBIN A1C
Hgb A1c MFr Bld: 5.8 %{Hb} — ABNORMAL HIGH (ref <5.7)
Mean Plasma Glucose: 120 mg/dL
eAG (mmol/L): 6.6 mmol/L

## 2023-11-20 LAB — LIPID PANEL
Cholesterol: 128 mg/dL (ref <200)
HDL: 46 mg/dL (ref >=40)
LDL Cholesterol (Calc): 64 mg/dL
Non-HDL Cholesterol (Calc): 82 mg/dL (ref <130)
Total CHOL/HDL Ratio: 2.8 (calc) (ref <5.0)
Triglycerides: 94 mg/dL (ref <150)

## 2023-11-30 ENCOUNTER — Ambulatory Visit (HOSPITAL_COMMUNITY)
Admission: RE | Admit: 2023-11-30 | Discharge: 2023-11-30 | Disposition: A | Discharge status: Home / Self Care | Payer: Self-pay | Source: Ambulatory Visit | Attending: Family Medicine | Admitting: Family Medicine

## 2023-11-30 DIAGNOSIS — E1169 Type 2 diabetes mellitus with other specified complication: Secondary | ICD-10-CM | POA: Insufficient documentation

## 2023-12-03 ENCOUNTER — Other Ambulatory Visit: Payer: Self-pay

## 2023-12-03 ENCOUNTER — Telehealth: Payer: Self-pay

## 2023-12-03 DIAGNOSIS — I2510 Atherosclerotic heart disease of native coronary artery without angina pectoris: Secondary | ICD-10-CM

## 2023-12-03 MED ORDER — ROSUVASTATIN CALCIUM 20 MG PO TABS: 20.0000 mg | ORAL_TABLET | Freq: Every day | ORAL | 3 refills | Status: AC

## 2023-12-03 MED ORDER — ASPIRIN 81 MG PO TBEC: 81.0000 mg | DELAYED_RELEASE_TABLET | Freq: Every day | ORAL | 1 refills | Status: DC

## 2023-12-03 NOTE — Telephone Encounter (Signed)
 Copied from CRM [id number]. Topic: Clinical - Lab/Test Results >> Dec 03, 2023  3:10 PM Lars Mage H wrote: Reason for CRM: Patient would like a call to explain his most recent CT results.

## 2023-12-03 NOTE — Telephone Encounter (Signed)
 Copied from CRM [id number]. Topic: General - Other >> Dec 03, 2023  7:41 AM Carlatta H wrote: Reason for CRM: Patient would like to have a call back from [employer] nurse due to life insurance policy being cancelled//His police has him listed being a smoker//Please call

## 2023-12-03 NOTE — Telephone Encounter (Signed)
 Copied from CRM [id number]. Topic: General - Other >> Dec 03, 2023  7:41 AM Carlatta H wrote: Reason for CRM: Patient would like to have a call back from [employer] nurse due to life insurance policy being cancelled//His police has him listed being a smoker//Please call >> Dec 03, 2023  3:14 PM Lars Mage H wrote: Patient wanted to know more about his CT results (different CRM was created for this item) and also provided some additional details regarding this CRM. His life insurance policy has been canceled due to records showing that he's a tobacco user - he informed that he has not smoked since 11/2021 and hasn't had any nicotine at all since 06/2022. Patient need some documentation or letter for his life insurance company - Please call patient when you have a moment.

## 2023-12-04 ENCOUNTER — Encounter: Payer: Self-pay | Admitting: Family Medicine

## 2023-12-06 ENCOUNTER — Other Ambulatory Visit: Payer: Self-pay | Admitting: Family Medicine

## 2023-12-06 ENCOUNTER — Telehealth: Payer: Self-pay

## 2023-12-06 MED ORDER — DICLOFENAC SODIUM 75 MG PO TBEC: 75.0000 mg | DELAYED_RELEASE_TABLET | Freq: Two times a day (BID) | ORAL | 1 refills | Status: DC

## 2023-12-06 NOTE — Telephone Encounter (Signed)
 Pt called with c/o bilateral knee pain. Pt asks if the prescription strength Voltaren can be sent into his pharmacy? Thanks.

## 2023-12-07 ENCOUNTER — Encounter: Payer: Self-pay | Admitting: Gastroenterology

## 2024-01-03 ENCOUNTER — Other Ambulatory Visit: Payer: Self-pay | Admitting: Family Medicine

## 2024-01-04 ENCOUNTER — Other Ambulatory Visit: Payer: Self-pay | Admitting: Family Medicine

## 2024-01-04 NOTE — Telephone Encounter (Signed)
 Copied from CRM [id number]. Topic: Clinical - Medication Refill >> Jan 04, 2024  2:04 PM Felizardo Hotter wrote: Most Recent Primary Care Visit:  Provider: Eliane Grooms T  Department: [facility]  Visit Type: PHYSICAL  Date: 11/19/2023  Medication: venlafaxine  (EFFEXOR ) 75 MG tablet  Has the patient contacted their pharmacy? Yes (Agent: If no, request that the patient contact the pharmacy for the refill. If patient does not wish to contact the pharmacy document the reason why and proceed with request.) (Agent: If yes, when and what did the pharmacy advise?)Pharmacy need approval  Is this the correct pharmacy for this prescription? Yes If no, delete pharmacy and type the correct one.  This is the patient's preferred pharmacy:  [id number] - [place], [place] - [address] AT [facility] OF [address] [place] [address] Phone: [phone] Fax: [phone]   Has the prescription been filled recently? Yes  Is the patient out of the medication? Yes  Has the patient been seen for an appointment in the last year OR does the patient have an upcoming appointment? Yes  Can we respond through MyChart? Yes  Agent: Please be advised that Rx refills may take up to 3 business days. We ask that you follow-up with your pharmacy.

## 2024-01-04 NOTE — Telephone Encounter (Signed)
 Requested medications are due for refill today.  unsure  Requested medications are on the active medications list.  yes  Last refill. 11/19/2023  Future visit scheduled.   no  Notes to clinic.  Medication is historical    Requested Prescriptions  Pending Prescriptions Disp Refills   venlafaxine  (EFFEXOR ) 75 MG tablet      Sig: Take 1 tablet (75 mg total) by mouth 2 (two) times daily with a meal.     Psychiatry: Antidepressants - SNRI - desvenlafaxine & venlafaxine  Failed - 01/04/2024  4:45 PM      Failed - Valid encounter within last 6 months    Recent Outpatient Visits           1 month ago General medical exam   [place] [facility] Austine Lefort, MD   10 months ago Type 2 diabetes mellitus with other specified complication, without long-term current use of insulin [facility])   [place] [facility] Austine Lefort, MD   1 year ago Type 2 diabetes mellitus with other specified complication, without long-term current use of insulin [facility])   [place] [facility] Pickard, Cisco Crest, MD   1 year ago Hypogonadism in male   [facility] Pickard, Cisco Crest, MD   1 year ago Viral URI with cough   [place] [facility] Pickard, Cisco Crest, MD       Future Appointments             In 1 month Wendie Hamburg, MD [place] [facility]            Failed - Lipid Panel in normal range within the last 12 months    Cholesterol  Date Value Ref Range Status  11/19/2023 128 <200 mg/dL Final   LDL Cholesterol (Calc)  Date Value Ref Range Status  11/19/2023 64 mg/dL (calc) Final    Comment:    Reference range: <100 . Desirable range <100 mg/dL for primary prevention;   <70 mg/dL for patients with CHD or diabetic patients  with > or = 2 CHD risk factors. Aaron Aas LDL-C is now calculated using the Martin-Hopkins  calculation, which is a validated novel method  providing  better accuracy than the Friedewald equation in the  estimation of LDL-C.  Melinda Sprawls et al. Erroll Heard. 1610;960(45): 2061-2068  (http://education.[facility].com/faq/FAQ164)    HDL  Date Value Ref Range Status  11/19/2023 46 > OR = 40 mg/dL Final   Triglycerides  Date Value Ref Range Status  11/19/2023 94 <150 mg/dL Final         Passed - Cr in normal range and within 360 days    Creat  Date Value Ref Range Status  11/19/2023 0.86 0.70 - 1.30 mg/dL Final   Creatinine, Urine  Date Value Ref Range Status  11/19/2023 61 20 - 320 mg/dL Final         Passed - Last BP in normal range    BP Readings from Last 1 Encounters:  11/19/23 124/82

## 2024-01-04 NOTE — Telephone Encounter (Signed)
 Requested medication (s) are due for refill today: routing for review  Requested medication (s) are on the active medication list: historical med  Last refill:  07/03/22 / Future visit scheduled: no  Notes to clinic:  Unable to refill per protocol, Rx expired. New prescription needed.      Requested Prescriptions  Pending Prescriptions Disp Refills   venlafaxine  XR (EFFEXOR -XR) 75 MG 24 hr capsule [Pharmacy Med Name: VENLAFAXINE  ER 75MG  CAPSULES] 90 capsule 3    Sig: TAKE 1 CAPSULE(75 MG) BY MOUTH DAILY WITH BREAKFAST     Psychiatry: Antidepressants - SNRI - desvenlafaxine & venlafaxine  Failed - 01/04/2024  9:17 AM      Failed - Valid encounter within last 6 months    Recent Outpatient Visits           1 month ago General medical exam   [facility] Austine Lefort, MD   10 months ago Type 2 diabetes mellitus with other specified complication, without long-term current use of insulin [facility])   [place] [facility] Austine Lefort, MD   1 year ago Type 2 diabetes mellitus with other specified complication, without long-term current use of insulin [facility])   [place] [facility] Pickard, Cisco Crest, MD   1 year ago Hypogonadism in male   [place] [facility] Pickard, Cisco Crest, MD   1 year ago Viral URI with cough   [place] [facility] Pickard, Cisco Crest, MD       Future Appointments             In 1 month Wendie Hamburg, MD [place] [facility]            Failed - Lipid Panel in normal range within the last 12 months    Cholesterol  Date Value Ref Range Status  11/19/2023 128 <200 mg/dL Final   LDL Cholesterol (Calc)  Date Value Ref Range Status  11/19/2023 64 mg/dL (calc) Final    Comment:    Reference range: <100 . Desirable range <100 mg/dL for primary prevention;   <70 mg/dL for patients with CHD or diabetic patients  with >  or = 2 CHD risk factors. Aaron Aas LDL-C is now calculated using the Martin-Hopkins  calculation, which is a validated novel method providing  better accuracy than the Friedewald equation in the  estimation of LDL-C.  Melinda Sprawls et al. Erroll Heard. 6045;409(81): 2061-2068  (http://education.[facility].com/faq/FAQ164)    HDL  Date Value Ref Range Status  11/19/2023 46 > OR = 40 mg/dL Final   Triglycerides  Date Value Ref Range Status  11/19/2023 94 <150 mg/dL Final         Passed - Cr in normal range and within 360 days    Creat  Date Value Ref Range Status  11/19/2023 0.86 0.70 - 1.30 mg/dL Final   Creatinine, Urine  Date Value Ref Range Status  11/19/2023 61 20 - 320 mg/dL Final         Passed - Last BP in normal range    BP Readings from Last 1 Encounters:  11/19/23 124/82

## 2024-01-10 ENCOUNTER — Other Ambulatory Visit: Payer: Self-pay

## 2024-01-10 ENCOUNTER — Telehealth: Payer: Self-pay

## 2024-01-10 MED ORDER — VENLAFAXINE HCL 75 MG PO TABS: 75.0000 mg | ORAL_TABLET | Freq: Two times a day (BID) | ORAL | 0 refills | Status: DC

## 2024-01-10 NOTE — Telephone Encounter (Signed)
 Copied from CRM [id number]. Topic: Clinical - Prescription Issue >> Jan 09, 2024  4:59 PM DeAngela L wrote: Reason for CRM: Patient is out of medication venlafaxine  (EFFEXOR ) 75 MG tablet his prescription was denied and he is confused why the prescription was denied and out of medication  [id number] - [place], [place] - [address] AT [facility] OF [address]  [place] [address] Phone: [phone] Fax: [phone] Patient call back number [phone] (M)

## 2024-01-31 ENCOUNTER — Other Ambulatory Visit: Payer: Self-pay | Admitting: Family Medicine

## 2024-02-01 ENCOUNTER — Encounter: Payer: Self-pay | Admitting: Gastroenterology

## 2024-02-01 ENCOUNTER — Ambulatory Visit: Admitting: Gastroenterology

## 2024-02-01 ENCOUNTER — Ambulatory Visit: Admitting: Nurse Practitioner

## 2024-02-01 VITALS — BP 130/80 | HR 78 | Ht 72.0 in | Wt 307.0 lb

## 2024-02-01 DIAGNOSIS — Z1211 Encounter for screening for malignant neoplasm of colon: Secondary | ICD-10-CM | POA: Diagnosis not present

## 2024-02-01 NOTE — Patient Instructions (Signed)
 _______________________________________________________  If your blood pressure at your visit was 140/90 or greater, please contact your primary care physician to follow up on this.  _______________________________________________________  If you are age 60 or older, your body mass index should be between 23-30. Your Body mass index is 41.64 kg/m. If this is out of the aforementioned range listed, please consider follow up with your Primary Care Provider.  If you are age 37 or younger, your body mass index should be between 19-25. Your Body mass index is 41.64 kg/m. If this is out of the aformentioned range listed, please consider follow up with your Primary Care Provider.   ________________________________________________________  The [place] [facility] providers would like to encourage you to use MYCHART to communicate with providers for non-urgent requests or questions.  Due to long hold times on the telephone, sending your provider a message by [facility] may be a faster and more efficient way to get a response.  Please allow 48 business hours for a response.  Please remember that this is for non-urgent requests.  _______________________________________________________ Thank you for trusting me with your gastrointestinal care. Deanna May, RNP

## 2024-02-01 NOTE — Progress Notes (Signed)
 Chief Complaint:discuss colon Primary GI Doctor: Dr. Karene Oto  HPI:  Patient is a 58 year old male patient with past medical history of anxiety, asthma, GERD, and DM, who was referred to me by Austine Lefort, MD on 11/19/23 to discuss colonoscopy.   Interval History    Patient presents to discuss colon screening colonoscopy. Patient denies altered bowel habits, abdominal pain, or rectal bleeding.   Patient denies GERD and dysphagia. Patient denies nausea, vomiting, or weight loss.  Former smoker, stopped 3 years ago. No alcohol.  Patient on baby ASA 81mg  po daily.  Patient's family history includes father with prostate CA at age 11.  Wt Readings from Last 3 Encounters:  02/01/24 (!) 307 lb (139.3 kg)  11/19/23 (!) 300 lb 6.4 oz (136.3 kg)  02/26/23 291 lb 6.4 oz (132.2 kg)    Past Medical History:  Diagnosis Date   Allergy    seasonal   Anxiety    work related   Asthma    Diabetes mellitus without complication (HCC)    Erectile dysfunction    GERD (gastroesophageal reflux disease)    Heart disease    2 blockages in his heart   Heart murmur    Hypertension    Hypogonadism in male    Tobacco abuse    Past Surgical History:  Procedure Laterality Date   CARDIOVASCULAR STRESS TEST     cardiolite    colonoscopy     LEFT HEART CATHETERIZATION WITH CORONARY ANGIOGRAM N/A 08/03/2014   Procedure: LEFT HEART CATHETERIZATION WITH CORONARY ANGIOGRAM;  Surgeon: Millicent Ally, MD;  Location: [facility] CATH LAB;  Service: Cardiovascular;  Laterality: N/A;   US  ECHOCARDIOGRAPHY  06/07/2004   Current Outpatient Medications  Medication Sig Dispense Refill   albuterol  (VENTOLIN  HFA) 108 (90 Base) MCG/ACT inhaler Inhale 2 puffs into the lungs every 6 (six) hours as needed for wheezing or shortness of breath. 18 g 3   aspirin  EC 81 MG tablet Take 1 tablet (81 mg total) by mouth daily. Swallow whole. 90 tablet 1   Blood Glucose Monitoring Suppl (ACCU-CHEK GUIDE) w/Device KIT      Blood  Glucose Monitoring Suppl DEVI 1 each by Does not apply route in the morning, at noon, and at bedtime. Benford Asch substitute to any manufacturer covered by patient's insurance. 1 each 0   Cetirizine HCl (ZYRTEC ALLERGY PO) Take 10 mg by mouth daily.     diclofenac  (VOLTAREN ) 75 MG EC tablet TAKE 1 TABLET(75 MG) BY MOUTH TWICE DAILY 180 tablet 0   empagliflozin  (JARDIANCE ) 25 MG TABS tablet Take 1 tablet (25 mg total) by mouth daily before breakfast. 30 tablet 11   enalapril  (VASOTEC ) 20 MG tablet TAKE 1 TABLET(20 MG) BY MOUTH DAILY 90 tablet 3   glucose blood (ACCU-CHEK GUIDE) test strip USE TO CHECK GLUCOSE THREE TIMES DAILY.(MORNING NOON AND BEDTIME) 100 strip 5   rosuvastatin  (CRESTOR ) 20 MG tablet Take 1 tablet (20 mg total) by mouth daily. 90 tablet 3   Testosterone  1.62 % GEL APPLY EXTERNALLY 2 PUMPS ONTO SKIN DAILY 75 g 1   venlafaxine  (EFFEXOR ) 75 MG tablet Take 1 tablet (75 mg total) by mouth 2 (two) times daily with a meal. 180 tablet 0   No current facility-administered medications for this visit.    Allergies as of 02/01/2024 - Review Complete 02/01/2024  Allergen Reaction Noted   Codeine Other (See Comments) 07/06/2014    Family History  Problem Relation Age of Onset   CAD Mother  Diabetes Mother    Heart disease Mother    Hypertension Father    Prostate cancer Father    Colon cancer Neg Hx    Esophageal cancer Neg Hx    Review of Systems:    Constitutional: No weight loss, fever, chills, weakness or fatigue HEENT: Eyes: No change in vision               Ears, Nose, Throat:  No change in hearing or congestion Skin: No rash or itching Cardiovascular: No chest pain, chest pressure or palpitations   Respiratory: No SOB or cough Gastrointestinal: See HPI and otherwise negative Genitourinary: No dysuria or change in urinary frequency Neurological: No headache, dizziness or syncope Musculoskeletal: No new muscle or joint pain Hematologic: No bleeding or bruising Psychiatric:  No history of depression or anxiety   Physical Exam:  Vital signs: BP 130/80   Pulse 78   Ht 6' (1.829 m)   Wt (!) 307 lb (139.3 kg)   BMI 41.64 kg/m   Constitutional:  Pleasant  male appears to be in NAD, Well developed, Well nourished, alert and cooperative Throat: Oral cavity and pharynx without inflammation, swelling or lesion.  Respiratory: Respirations even and unlabored. Lungs clear to auscultation bilaterally.   No wheezes, crackles, or rhonchi.  Cardiovascular: Normal S1, S2. Regular rate and rhythm. No peripheral edema, cyanosis or pallor.  Gastrointestinal:  Soft, nondistended, nontender. No rebound or guarding. Normal bowel sounds. No appreciable masses or hepatomegaly. Rectal:  Not performed.  Msk:  Symmetrical without gross deformities. Without edema, no deformity or joint abnormality.  Neurologic:  Alert and  oriented x4;  grossly normal neurologically.  Skin:   Dry and intact without significant lesions or rashes. Psychiatric: Oriented to person, place and time. Demonstrates good judgement and reason without abnormal affect or behaviors.  RELEVANT LABS AND IMAGING: CBC    Latest Ref Rng & Units 11/19/2023    9:23 AM 02/26/2023    4:22 PM 11/20/2022    8:18 AM  CBC  WBC 3.8 - 10.8 Thousand/uL 7.0  10.0  5.7   Hemoglobin 13.2 - 17.1 g/dL 16.1  09.6  04.5   Hematocrit 38.5 - 50.0 % 51.5  45.4  49.8   Platelets 140 - 400 Thousand/uL 218  262  216      CMP     Latest Ref Rng & Units 11/19/2023    9:23 AM 02/26/2023    4:22 PM 11/20/2022    8:18 AM  CMP  Glucose 65 - 99 mg/dL 409  94  811   BUN 7 - 25 mg/dL 13  21  15    Creatinine 0.70 - 1.30 mg/dL 9.14  7.82  9.56   Sodium 135 - 146 mmol/L 137  138  134   Potassium 3.5 - 5.3 mmol/L 4.7  4.7  4.1   Chloride 98 - 110 mmol/L 102  103  98   CO2 20 - 32 mmol/L 25  25  22    Calcium  8.6 - 10.3 mg/dL 9.6  9.8  9.3   Total Protein 6.1 - 8.1 g/dL 7.1  7.2  7.2   Total Bilirubin 0.2 - 1.2 mg/dL 0.4  0.5  0.5   AST 10 - 35  U/L 15  12  9    ALT 9 - 46 U/L 19  16  17       Lab Results  Component Value Date   TSH 1.93 02/04/2018  07/20/2014 colonoscopy  with Dr. Honey Lusty- diverticulosis Recall 10  years  Assessment: Encounter Diagnosis  Name Primary?   Special screening for malignant neoplasms, colon Yes  59 year old male patient due for colon screening colonoscopy, will schedule with [facility]. Delona Ferron in [facility]. No GI issues.  Plan: - request records of colonoscopy from [facility] - Schedule for a colonoscopy in [facility] with Dr. Clarisse Crosby. The risks and benefits of colonoscopy with possible polypectomy / biopsies were discussed and the patient agrees to proceed.     Thank you for the courtesy of this consult. Please call me with any questions or concerns.   Carling Liberman, FNP-C [facility] 02/01/2024, 3:08 PM  Cc: Austine Lefort, MD

## 2024-02-01 NOTE — Telephone Encounter (Signed)
 Requested Prescriptions  Pending Prescriptions Disp Refills   diclofenac  (VOLTAREN ) 75 MG EC tablet [Pharmacy Med Name: DICLOFENAC  SODIUM 75MG  DR TABLETS] 180 tablet 0    Sig: TAKE 1 TABLET(75 MG) BY MOUTH TWICE DAILY     Analgesics:  NSAIDS Failed - 02/01/2024  1:14 PM      Failed - Manual Review: Labs are only required if the patient has taken medication for more than 8 weeks.      Failed - HGB in normal range and within 360 days    Hemoglobin  Date Value Ref Range Status  11/19/2023 17.2 (H) 13.2 - 17.1 g/dL Final         Failed - HCT in normal range and within 360 days    HCT  Date Value Ref Range Status  11/19/2023 51.5 (H) 38.5 - 50.0 % Final         Failed - Valid encounter within last 12 months    Recent Outpatient Visits           2 months ago General medical exam   [place] [facility] Austine Lefort, MD   11 months ago Type 2 diabetes mellitus with other specified complication, without long-term current use of insulin [facility])   [place] [facility] Austine Lefort, MD   1 year ago Type 2 diabetes mellitus with other specified complication, without long-term current use of insulin [facility])   [place] [facility] Pickard, Cisco Crest, MD   1 year ago Hypogonadism in male   [place] [facility] Pickard, Cisco Crest, MD   1 year ago Viral URI with cough   [place] [facility] Pickard, Cisco Crest, MD       Future Appointments             In 3 weeks Wendie Hamburg, MD [facility] [place], H&[facility]            Passed - Cr in normal range and within 360 days    Creat  Date Value Ref Range Status  11/19/2023 0.86 0.70 - 1.30 mg/dL Final   Creatinine, Urine  Date Value Ref Range Status  11/19/2023 61 20 - 320 mg/dL Final         Passed - PLT in normal range and within 360 days    Platelets  Date Value Ref Range Status   11/19/2023 218 140 - 400 Thousand/uL Final         Passed - eGFR is 30 or above and within 360 days    GFR, Est African American  Date Value Ref Range Status  04/30/2020 100 > OR = 60 mL/min/1.42m2 Final   GFR, Est Non African American  Date Value Ref Range Status  04/30/2020 86 > OR = 60 mL/min/1.43m2 Final   GFR  Date Value Ref Range Status  07/28/2014 91.34 >60.00 mL/min Final   eGFR  Date Value Ref Range Status  11/19/2023 100 > OR = 60 mL/min/1.51m2 Final         Passed - Patient is not pregnant

## 2024-02-07 NOTE — Progress Notes (Signed)
 Agree with the assessment and plan as outlined by [facility], FNP-C.  Carlitos Bottino, DO, [facility]

## 2024-02-28 ENCOUNTER — Encounter: Payer: Self-pay | Admitting: Cardiology

## 2024-02-28 ENCOUNTER — Telehealth: Payer: Self-pay | Admitting: Radiology

## 2024-02-28 ENCOUNTER — Telehealth: Payer: Self-pay | Admitting: *Deleted

## 2024-02-28 ENCOUNTER — Ambulatory Visit: Attending: Cardiology | Admitting: Cardiology

## 2024-02-28 VITALS — BP 138/78 | HR 66 | Ht 72.0 in | Wt 305.0 lb

## 2024-02-28 DIAGNOSIS — E785 Hyperlipidemia, unspecified: Secondary | ICD-10-CM

## 2024-02-28 DIAGNOSIS — R9431 Abnormal electrocardiogram [ECG] [EKG]: Secondary | ICD-10-CM

## 2024-02-28 DIAGNOSIS — R0609 Other forms of dyspnea: Secondary | ICD-10-CM | POA: Diagnosis not present

## 2024-02-28 DIAGNOSIS — I10 Essential (primary) hypertension: Secondary | ICD-10-CM

## 2024-02-28 DIAGNOSIS — I2510 Atherosclerotic heart disease of native coronary artery without angina pectoris: Secondary | ICD-10-CM

## 2024-02-28 DIAGNOSIS — I493 Ventricular premature depolarization: Secondary | ICD-10-CM | POA: Diagnosis not present

## 2024-02-28 DIAGNOSIS — R0683 Snoring: Secondary | ICD-10-CM

## 2024-02-28 NOTE — Patient Instructions (Addendum)
 Medication Instructions:  Continue current medications *If you need a refill on your cardiac medications before your next appointment, please call your pharmacy*  Lab Work: Lipid panel If you have labs (blood work) drawn today and your tests are completely normal, you will receive your results only by: MyChart Message (if you have MyChart) OR A paper copy in the mail If you have any lab test that is abnormal or we need to change your treatment, we will call you to review the results.  Testing/Procedures: Echo Your physician has requested that you have an echocardiogram. Echocardiography is a painless test that uses sound waves to create images of your heart. It provides your doctor with information about the size and shape of your heart and how well your heart's chambers and valves are working. This procedure takes approximately one hour. There are no restrictions for this procedure. Please do NOT wear cologne, perfume, aftershave, or lotions (deodorant is allowed). Please arrive 15 minutes prior to your appointment time.  Please note: We ask at that you not bring children with you during ultrasound (echo/ vascular) testing. Due to room size and safety concerns, children are not allowed in the ultrasound rooms during exams. Our front office staff cannot provide observation of children in our lobby area while testing is being conducted. An adult accompanying a patient to their appointment will only be allowed in the ultrasound room at the discretion of the ultrasound technician under special circumstances. We apologize for any inconvenience.   Follow-Up: At [facility], you and your health needs are our priority.  As part of our continuing mission to provide you with exceptional heart care, our providers are all part of one team.  This team includes your primary Cardiologist (physician) and Advanced Practice Providers or APPs (Physician Assistants and Nurse Practitioners) who all work  together to provide you with the care you need, when you need it.  Your next appointment:   4 month(s)  Provider:   Dr. Alda Amas  Chart is used to connect with patients for Virtual Visits (Telemedicine).  Patients are able to view lab/test results, encounter notes, upcoming appointments, etc.  Non-urgent messages can be sent to your provider as well.   To learn more about what you can do with MyChart, go to [email or web address].   Other Instructions [employer]  WatchPAT?  Is a FDA cleared portable home sleep study test that uses a watch and 3 points of contact to monitor 7 different channels, including your heart rate, oxygen saturations, body position, snoring, and chest motion.  The study is easy to use from the comfort of your own home and accurately detect sleep apnea.  Before bed, you attach the chest sensor, attached the sleep apnea bracelet to your nondominant hand, and attach the finger probe.  After the study, the raw data is downloaded from the watch and scored for apnea events.   For more information: [email or web address]  Patient Testing Instructions:  Do not put battery into the device until bedtime when you are ready to begin the test. Please call the support number if you need assistance after following the instructions below: 24 hour support line- [phone] or IT[employer] support at [phone] (option 2)  Download the [employer] WatchPAT One app through the google play store or App Store  Be sure to turn on or enable access to bluetooth in settlings on your smartphone/ device  Make sure no other bluetooth devices are on and within the vicinity of your smartphone/ device and WatchPAT watch  during testing.  Make sure to leave your smart phone/ device plugged in and charging all night.  When ready for bed:  Follow the instructions step by step in the WatchPAT One App to activate the testing device. For additional instructions, including video instruction, visit  the WatchPAT One video on Youtube. You can search for WatchPat One within Youtube (video is 4 minutes and 18 seconds) or enter: https://youtube/[email or web address]?[email or web address] Please note: You will be prompted to enter a Pin to connect via bluetooth when starting the test. The PIN will be assigned to you when you receive the test.  The device is disposable, but it recommended that you retain the device until you receive a call letting you know the study has been received and the results have been interpreted.  We will let you know if the study did not transmit to us  properly after the test is completed. You do not need to call us  to confirm the receipt of the test.  Please complete the test within 48 hours of receiving PIN.   Frequently Asked Questions:  What is Watch Deatra Face one?  A single use fully disposable home sleep apnea testing device and will not need to be returned after completion.  What are the requirements to use WatchPAT one?  The be able to have a successful watchpat one sleep study, you should have your Watch pat one device, your smart phone, watch pat one app, your PIN number and Internet access What type of phone do I need?  You should have a smart phone that uses Android 5.1 and above or any Iphone with IOS 10 and above How can I download the WatchPAT one app?  Based on your device type search for WatchPAT one app either in google play for android devices or APP store for Iphone's Where will I get my PIN for the study?  Your PIN will be provided by your physician's office. It is used for authentication and if you lose/forget your PIN, please reach out to your providers office.  I do not have Internet at home. Can I do WatchPAT one study?  WatchPAT One needs Internet connection throughout the night to be able to transmit the sleep data. You can use your home/local internet or your cellular's data package. However, it is always recommended to use home/local Internet. It is estimated that between  20MB-30MB will be used with each study.However, the application will be looking for space in the phone to start the study.  What happens if I lose internet or bluetooth connection?  During the internet disconnection, your phone will not be able to transmit the sleep data. All the data, will be stored in your phone. As soon as the internet connection is back on, the phone will being sending the sleep data. During the bluetooth disconnection, WatchPAT one will not be able to to send the sleep data to your phone. Data will be kept in the WatchPAT one until two devices have bluetooth connection back on. As soon as the connection is back on, WatchPAT one will send the sleep data to the phone.  How long do I need to wear the WatchPAT one?  After you start the study, you should wear the device at least 6 hours.  How far should I keep my phone from the device?  During the night, your phone should be within 15 feet.  What happens if I leave the room for restroom or other reasons?  Leaving the room for any reason will  not cause any problem. As soon as your get back to the room, both devices will reconnect and will continue to send the sleep data. Can I use my phone during the sleep study?  Yes, you can use your phone as usual during the study. But it is recommended to put your watchpat one on when you are ready to go to bed.  How will I get my study results?  A soon as you completed your study, your sleep data will be sent to the provider. They will then share the results with you when they are ready.          Please report to Radiology at the [facility] Main Entrance 30 minutes early for your test.  [address] [place], [place] [address]                    How to Prepare for Your Cardiac PET/CT Stress Test:  Nothing to eat or drink, except water, 3 hours prior to arrival time.  NO caffeine/decaffeinated products, or chocolate 12 hours prior to arrival. (Please note decaffeinated  beverages (teas/coffees) still contain caffeine).  If you have caffeine within 12 hours prior, the test will need to be rescheduled.  Medication instructions: Do not take erectile dysfunction medications for 72 hours prior to test (sildenafil , tadalafil) Do not take nitrates (isosorbide mononitrate, Ranexa) the day before or day of test Do not take tamsulosin the day before or morning of test Hold theophylline containing medications for 12 hours. Hold Dipyridamole 48 hours prior to the test.  Diabetic Preparation: If able to eat breakfast prior to 3 hour fasting, you may take all medications, including your insulin. Do not worry if you miss your breakfast dose of insulin - start at your next meal. If you do not eat prior to 3 hour fast-Hold all diabetes (oral and insulin) medications. Patients who wear a continuous glucose monitor MUST remove the device prior to scanning.  You may take your remaining medications with water.  NO perfume, cologne or lotion on chest or abdomen area.   Total time is 1 to 2 hours; you may want to bring reading material for the waiting time.   In preparation for your appointment, medication and supplies will be purchased.  Appointment availability is limited, so if you need to cancel or reschedule, please call the Radiology Department Scheduler at [phone] 24 hours in advance to avoid a cancellation fee of $100.00  What to Expect When you Arrive:  Once you arrive and check in for your appointment, you will be taken to a preparation room within the Radiology Department.  A technologist or Nurse will obtain your medical history, verify that you are correctly prepped for the exam, and explain the procedure.  Afterwards, an IV will be started in your arm and electrodes will be placed on your skin for EKG monitoring during the stress portion of the exam. Then you will be escorted to the PET/CT scanner.  There, staff will get you positioned on the scanner and  obtain a blood pressure and EKG.  During the exam, you will continue to be connected to the EKG and blood pressure machines.  A small, safe amount of a radioactive tracer will be injected in your IV to obtain a series of pictures of your heart along with an injection of a stress agent.    After your Exam:  It is recommended that you eat a meal and drink a caffeinated beverage  to counter act any effects of the stress agent.  Drink plenty of fluids for the remainder of the day and urinate frequently for the first couple of hours after the exam.  Your doctor will inform you of your test results within 7-10 business days.  For more information and frequently asked questions, please visit our website: [email or web address]  For questions about your test or how to prepare for your test, please call: Cardiac Imaging Nurse Navigators Office: [phone]

## 2024-02-28 NOTE — Telephone Encounter (Signed)
 error

## 2024-02-28 NOTE — Telephone Encounter (Signed)
 Patient agreement reviewed and signed on 02/28/2024.  WatchPAT issued to patient on 02/28/2024 by Nathalie Baize. Patient aware to not open the WatchPAT box until contacted with the activation PIN. Patient profile initialized in CloudPAT on 02/28/2024 by Jenise Mixer. Device serial number: [id number]  Please list Reason for Call as Advice Only and type WatchPAT issued to patient in the comment box.

## 2024-02-28 NOTE — Progress Notes (Signed)
 Cardiology Office Note:    Date:  02/28/2024   ID:  George Brock, DOB 08-18-1966, MRN [id number]  PCP:  Austine Lefort, MD  Cardiologist:  None  Electrophysiologist:  None   Referring MD: Austine Lefort, MD   Chief Complaint  Patient presents with   Coronary Artery Disease    History of Present Illness:    George Brock is a 58 y.o. male with a hx of CAD, prior tobacco use, T2DM, morbid obesity who is referred by Dr. Cheril Cork for evaluation of CAD.  Calcium  score on 11/30/2023 was 555 (90th percentile).  Previously followed with Dr. Alvis Ba, last seen in 2017.  Had false positive nuclear stress test in 2015, underwent cardiac catheterization in 2015 which showed no significant CAD.  He denies any chest pain.  Does report however that he tires out with exertion.  He denies any lightheadedness, syncope, or palpitations.  Has noted some lower extremity edema.  He denies any palpitations.  He smoked about 1 pack/day, quit in 2023.  Quit vaping in 2023 as well.  Family history includes mother had CHF and A-fib.  Past Medical History:  Diagnosis Date   Allergy    seasonal   Anxiety    work related   Asthma    Diabetes mellitus without complication (HCC)    Erectile dysfunction    GERD (gastroesophageal reflux disease)    Heart disease    2 blockages in his heart   Heart murmur    Hypertension    Hypogonadism in male    Tobacco abuse     Past Surgical History:  Procedure Laterality Date   CARDIOVASCULAR STRESS TEST     cardiolite    colonoscopy     LEFT HEART CATHETERIZATION WITH CORONARY ANGIOGRAM N/A 08/03/2014   Procedure: LEFT HEART CATHETERIZATION WITH CORONARY ANGIOGRAM;  Surgeon: Millicent Ally, MD;  Location: [facility] CATH LAB;  Service: Cardiovascular;  Laterality: N/A;   US  ECHOCARDIOGRAPHY  06/07/2004    Current Medications: Current Meds  Medication Sig   albuterol  (VENTOLIN  HFA) 108 (90 Base) MCG/ACT inhaler Inhale 2 puffs into the lungs every 6 (six)  hours as needed for wheezing or shortness of breath.   aspirin  EC 81 MG tablet Take 1 tablet (81 mg total) by mouth daily. Swallow whole.   Blood Glucose Monitoring Suppl (ACCU-CHEK GUIDE) w/Device KIT    Blood Glucose Monitoring Suppl DEVI 1 each by Does not apply route in the morning, at noon, and at bedtime. May substitute to any manufacturer covered by patient's insurance.   Cetirizine HCl (ZYRTEC ALLERGY PO) Take 10 mg by mouth daily.   diclofenac  (VOLTAREN ) 75 MG EC tablet TAKE 1 TABLET(75 MG) BY MOUTH TWICE DAILY   empagliflozin  (JARDIANCE ) 25 MG TABS tablet Take 1 tablet (25 mg total) by mouth daily before breakfast.   enalapril  (VASOTEC ) 20 MG tablet TAKE 1 TABLET(20 MG) BY MOUTH DAILY   glucose blood (ACCU-CHEK GUIDE) test strip USE TO CHECK GLUCOSE THREE TIMES DAILY.(MORNING NOON AND BEDTIME)   rosuvastatin  (CRESTOR ) 20 MG tablet Take 1 tablet (20 mg total) by mouth daily.   venlafaxine  (EFFEXOR ) 75 MG tablet Take 1 tablet (75 mg total) by mouth 2 (two) times daily with a meal.     Allergies:   Codeine   Social History   Socioeconomic History   Marital status: Married    Spouse name: Janalyn Me   Number of children: 0   Years of education: Not on file   Highest  education level: Not on file  Occupational History   Occupation: Air cabin crew, Technical sales engineer  Tobacco Use   Smoking status: Former    Types: Cigarettes   Smokeless tobacco: Never  Vaping Use   Vaping status: Never Used  Substance and Sexual Activity   Alcohol use: Not Currently   Drug use: No   Sexual activity: Yes    Birth control/protection: None  Other Topics Concern   Not on file  Social History Narrative   Not on file   Social Drivers of Health   Financial Resource Strain: Not on file  Food Insecurity: Not on file  Transportation Needs: Not on file  Physical Activity: Not on file  Stress: Not on file  Social Connections: Not on file     Family History: The patient's family history  includes CAD in his mother; Diabetes in his mother; Heart disease in his mother; Hypertension in his father; Prostate cancer in his father. There is no history of Colon cancer or Esophageal cancer.  ROS:   Please see the history of present illness.     All other systems reviewed and are negative.  EKGs/Labs/Other Studies Reviewed:    The following studies were reviewed today:   EKG:   02/28/2024: Normal sinus rhythm, left bundle branch block, rate 66  Recent Labs: 11/19/2023: ALT 19; BUN 13; Creat 0.86; Hemoglobin 17.2; Platelets 218; Potassium 4.7; Sodium 137  Recent Lipid Panel    Component Value Date/Time   CHOL 128 11/19/2023 0923   TRIG 94 11/19/2023 0923   HDL 46 11/19/2023 0923   CHOLHDL 2.8 11/19/2023 0923   VLDL 12 11/20/2016 0919   LDLCALC 64 11/19/2023 0923    Physical Exam:    VS:  BP 138/78 (BP Location: Right Arm, Patient Position: Sitting, Cuff Size: Normal)   Pulse 66   Ht 6' (1.829 m)   Wt (!) 305 lb (138.3 kg)   SpO2 96%   BMI 41.37 kg/m     Wt Readings from Last 3 Encounters:  02/28/24 (!) 305 lb (138.3 kg)  02/01/24 (!) 307 lb (139.3 kg)  11/19/23 (!) 300 lb 6.4 oz (136.3 kg)     GEN:  Well nourished, well developed in no acute distress HEENT: Normal NECK: No JVD; No carotid bruits LYMPHATICS: No lymphadenopathy CARDIAC: RRR, no murmurs, rubs, gallops RESPIRATORY:  Clear to auscultation without rales, wheezing or rhonchi  ABDOMEN: Soft, non-tender, non-distended MUSCULOSKELETAL:  No edema; No deformity  SKIN: Warm and dry NEUROLOGIC:  Alert and oriented x 3 PSYCHIATRIC:  Normal affect   ASSESSMENT:    1. Coronary artery disease involving native coronary artery of native heart, unspecified whether angina present   2. Dyspnea on exertion   3. Asymptomatic PVCs   4. Nonspecific abnormal electrocardiogram (ECG) (EKG)   5. Snoring   6. Hyperlipidemia, unspecified hyperlipidemia type   7. Essential hypertension    PLAN:    CAD: Calcium   score on 11/30/2023 was 555 (90th percentile). Had false positive nuclear stress test in 2015, underwent cardiac catheterization in 2015 which showed no significant CAD. - Continue aspirin  81 mg daily - Continue rosuvastatin  20 mg daily - Denies any chest pain but does report dyspnea on exertion that could represent anginal equivalent.  He is not a treadmill candidate.  Recommend stress PET to evaluate for ischemia  Abnormal EKG: Left bundle branch block on EKG, will check echocardiogram to evaluate for structural heart disease  Hypertension: On enalapril  20 mg daily.  Appears controlled  Hyperlipidemia: On rosuvastatin  20 mg daily.  LDL 64 on 11/19/2023  T2DM: Z6X 13.8% on 11/30/2022.  Has significantly improved, A1c 5.8% on 11/19/2023.  On Jardiance   Morbid obesity: Body mass index is 41.37 kg/m. Diet/exercise recommended  Snoring/daytime somnolence: Check Itamar sleep study.  STOP-BANG 6  RTC in 4 months  Informed Consent   Shared Decision Making/Informed Consent The risks [chest pain, shortness of breath, cardiac arrhythmias, dizziness, blood pressure fluctuations, myocardial infarction, stroke/transient ischemic attack, nausea, vomiting, allergic reaction, radiation exposure, metallic taste sensation and life-threatening complications (estimated to be 1 in 10,000)], benefits (risk stratification, diagnosing coronary artery disease, treatment guidance) and alternatives of a cardiac PET stress test were discussed in detail with Mr. Sackmann and he agrees to proceed.      Medication Adjustments/Labs and Tests Ordered: Current medicines are reviewed at length with the patient today.  Concerns regarding medicines are outlined above.  Orders Placed This Encounter  Procedures   NM PET CT CARDIAC PERFUSION MULTI W/ABSOLUTE BLOODFLOW   Lipid panel   Cardiac Stress Test: Informed Consent Details: Physician/Practitioner Attestation; Transcribe to consent form and obtain patient signature   EKG  12-Lead   ECHOCARDIOGRAM COMPLETE   Itamar Sleep Study   No orders of the defined types were placed in this encounter.   Patient Instructions  Medication Instructions:  Continue current medications *If you need a refill on your cardiac medications before your next appointment, please call your pharmacy*  Lab Work: Lipid panel If you have labs (blood work) drawn today and your tests are completely normal, you will receive your results only by: MyChart Message (if you have MyChart) OR A paper copy in the mail If you have any lab test that is abnormal or we need to change your treatment, we will call you to review the results.  Testing/Procedures: Echo Your physician has requested that you have an echocardiogram. Echocardiography is a painless test that uses sound waves to create images of your heart. It provides your doctor with information about the size and shape of your heart and how well your heart's chambers and valves are working. This procedure takes approximately one hour. There are no restrictions for this procedure. Please do NOT wear cologne, perfume, aftershave, or lotions (deodorant is allowed). Please arrive 15 minutes prior to your appointment time.  Please note: We ask at that you not bring children with you during ultrasound (echo/ vascular) testing. Due to room size and safety concerns, children are not allowed in the ultrasound rooms during exams. Our front office staff cannot provide observation of children in our lobby area while testing is being conducted. An adult accompanying a patient to their appointment will only be allowed in the ultrasound room at the discretion of the ultrasound technician under special circumstances. We apologize for any inconvenience.   Follow-Up: At [facility], you and your health needs are our priority.  As part of our continuing mission to provide you with exceptional heart care, our providers are all part of one team.  This  team includes your primary Cardiologist (physician) and Advanced Practice Providers or APPs (Physician Assistants and Nurse Practitioners) who all work together to provide you with the care you need, when you need it.  Your next appointment:   4 month(s)  Provider:   Dr. Alda Amas  Chart is used to connect with patients for Virtual Visits (Telemedicine).  Patients are able to view lab/test results, encounter notes, upcoming appointments, etc.  Non-urgent messages can be sent to  your provider as well.   To learn more about what you can do with MyChart, go to [email or web address].   Other Instructions Itamar  WatchPAT?  Is a FDA cleared portable home sleep study test that uses a watch and 3 points of contact to monitor 7 different channels, including your heart rate, oxygen saturations, body position, snoring, and chest motion.  The study is easy to use from the comfort of your own home and accurately detect sleep apnea.  Before bed, you attach the chest sensor, attached the sleep apnea bracelet to your nondominant hand, and attach the finger probe.  After the study, the raw data is downloaded from the watch and scored for apnea events.   For more information: [email or web address]  Patient Testing Instructions:  Do not put battery into the device until bedtime when you are ready to begin the test. Please call the support number if you need assistance after following the instructions below: 24 hour support line- [phone] or ITAMAR support at [phone] (option 2)  Download the Itamar WatchPAT One app through the google play store or App Store  Be sure to turn on or enable access to bluetooth in settlings on your smartphone/ device  Make sure no other bluetooth devices are on and within the vicinity of your smartphone/ device and WatchPAT watch during testing.  Make sure to leave your smart phone/ device plugged in and charging all night.  When ready for bed:   Follow the instructions step by step in the WatchPAT One App to activate the testing device. For additional instructions, including video instruction, visit the WatchPAT One video on Youtube. You can search for WatchPat One within Youtube (video is 4 minutes and 18 seconds) or enter: https://youtube/watch?[email or web address]_vbiwxE Please note: You will be prompted to enter a Pin to connect via bluetooth when starting the test. The PIN will be assigned to you when you receive the test.  The device is disposable, but it recommended that you retain the device until you receive a call letting you know the study has been received and the results have been interpreted.  We will let you know if the study did not transmit to us  properly after the test is completed. You do not need to call us  to confirm the receipt of the test.  Please complete the test within 48 hours of receiving PIN.   Frequently Asked Questions:  What is Watch Deatra Face one?  A single use fully disposable home sleep apnea testing device and will not need to be returned after completion.  What are the requirements to use WatchPAT one?  The be able to have a successful watchpat one sleep study, you should have your Watch pat one device, your smart phone, watch pat one app, your PIN number and Internet access What type of phone do I need?  You should have a smart phone that uses Android 5.1 and above or any Iphone with IOS 10 and above How can I download the WatchPAT one app?  Based on your device type search for WatchPAT one app either in google play for android devices or APP store for Iphone's Where will I get my PIN for the study?  Your PIN will be provided by your physician's office. It is used for authentication and if you lose/forget your PIN, please reach out to your providers office.  I do not have Internet at home. Can I do WatchPAT one study?  WatchPAT One needs Internet connection throughout the night to be able to  transmit the sleep data. You  can use your home/local internet or your cellular's data package. However, it is always recommended to use home/local Internet. It is estimated that between 20MB-30MB will be used with each study.However, the application will be looking for space in the phone to start the study.  What happens if I lose internet or bluetooth connection?  During the internet disconnection, your phone will not be able to transmit the sleep data. All the data, will be stored in your phone. As soon as the internet connection is back on, the phone will being sending the sleep data. During the bluetooth disconnection, WatchPAT one will not be able to to send the sleep data to your phone. Data will be kept in the WatchPAT one until two devices have bluetooth connection back on. As soon as the connection is back on, WatchPAT one will send the sleep data to the phone.  How long do I need to wear the WatchPAT one?  After you start the study, you should wear the device at least 6 hours.  How far should I keep my phone from the device?  During the night, your phone should be within 15 feet.  What happens if I leave the room for restroom or other reasons?  Leaving the room for any reason will not cause any problem. As soon as your get back to the room, both devices will reconnect and will continue to send the sleep data. Can I use my phone during the sleep study?  Yes, you can use your phone as usual during the study. But it is recommended to put your watchpat one on when you are ready to go to bed.  How will I get my study results?  A soon as you completed your study, your sleep data will be sent to the provider. They will then share the results with you when they are ready.          Please report to Radiology at the [facility] Main Entrance 30 minutes early for your test.  [address] [place], [place] [address]                    How to Prepare for Your Cardiac PET/CT Stress Test:  Nothing to eat  or drink, except water, 3 hours prior to arrival time.  NO caffeine/decaffeinated products, or chocolate 12 hours prior to arrival. (Please note decaffeinated beverages (teas/coffees) still contain caffeine).  If you have caffeine within 12 hours prior, the test will need to be rescheduled.  Medication instructions: Do not take erectile dysfunction medications for 72 hours prior to test (sildenafil , tadalafil) Do not take nitrates (isosorbide mononitrate, Ranexa) the day before or day of test Do not take tamsulosin the day before or morning of test Hold theophylline containing medications for 12 hours. Hold Dipyridamole 48 hours prior to the test.  Diabetic Preparation: If able to eat breakfast prior to 3 hour fasting, you may take all medications, including your insulin. Do not worry if you miss your breakfast dose of insulin - start at your next meal. If you do not eat prior to 3 hour fast-Hold all diabetes (oral and insulin) medications. Patients who wear a continuous glucose monitor MUST remove the device prior to scanning.  You may take your remaining medications with water.  NO perfume, cologne or lotion on chest or abdomen area.   Total time is 1 to 2 hours; you may want to bring reading  material for the waiting time.   In preparation for your appointment, medication and supplies will be purchased.  Appointment availability is limited, so if you need to cancel or reschedule, please call the Radiology Department Scheduler at [phone] 24 hours in advance to avoid a cancellation fee of $100.00  What to Expect When you Arrive:  Once you arrive and check in for your appointment, you will be taken to a preparation room within the Radiology Department.  A technologist or Nurse will obtain your medical history, verify that you are correctly prepped for the exam, and explain the procedure.  Afterwards, an IV will be started in your arm and electrodes will be placed on your skin for EKG  monitoring during the stress portion of the exam. Then you will be escorted to the PET/CT scanner.  There, staff will get you positioned on the scanner and obtain a blood pressure and EKG.  During the exam, you will continue to be connected to the EKG and blood pressure machines.  A small, safe amount of a radioactive tracer will be injected in your IV to obtain a series of pictures of your heart along with an injection of a stress agent.    After your Exam:  It is recommended that you eat a meal and drink a caffeinated beverage to counter act any effects of the stress agent.  Drink plenty of fluids for the remainder of the day and urinate frequently for the first couple of hours after the exam.  Your doctor will inform you of your test results within 7-10 business days.  For more information and frequently asked questions, please visit our website: [email or web address]  For questions about your test or how to prepare for your test, please call: Cardiac Imaging Nurse Navigators Office: [phone]        Signed, Wendie Hamburg, MD  02/28/2024 6:10 PM    [facility]

## 2024-03-07 ENCOUNTER — Other Ambulatory Visit: Payer: Self-pay

## 2024-03-07 ENCOUNTER — Telehealth: Payer: Self-pay

## 2024-03-07 DIAGNOSIS — E785 Hyperlipidemia, unspecified: Secondary | ICD-10-CM

## 2024-03-07 NOTE — Telephone Encounter (Signed)
 Called patient and patient said he was not able to fast for lab work. Patient verbalized he needed to eat before work. Made patient aware that provider will be made aware.

## 2024-03-07 NOTE — Telephone Encounter (Signed)
 Received call from lab requesting lab order to be released. Patient is present for to have lipid panel drawn, he states he has not been fasting.

## 2024-03-08 LAB — LIPID PANEL
Chol/HDL Ratio: 2.6 ratio (ref 0.0–5.0)
Cholesterol, Total: 87 mg/dL — ABNORMAL LOW (ref 100–199)
HDL: 33 mg/dL — ABNORMAL LOW (ref >39)
LDL Chol Calc (NIH): 31 mg/dL (ref 0–99)
Triglycerides: 133 mg/dL (ref 0–149)
VLDL Cholesterol Cal: 23 mg/dL (ref 5–40)

## 2024-03-10 ENCOUNTER — Ambulatory Visit: Payer: Self-pay | Admitting: Cardiology

## 2024-03-10 DIAGNOSIS — I4891 Unspecified atrial fibrillation: Secondary | ICD-10-CM

## 2024-03-24 ENCOUNTER — Other Ambulatory Visit: Payer: Self-pay | Admitting: Family Medicine

## 2024-04-14 ENCOUNTER — Ambulatory Visit (HOSPITAL_COMMUNITY)
Admission: RE | Admit: 2024-04-14 | Discharge: 2024-04-14 | Disposition: A | Discharge status: Home / Self Care | Source: Ambulatory Visit | Attending: Cardiology | Admitting: Cardiology

## 2024-04-14 DIAGNOSIS — R9431 Abnormal electrocardiogram [ECG] [EKG]: Secondary | ICD-10-CM | POA: Insufficient documentation

## 2024-04-14 LAB — ECHOCARDIOGRAM COMPLETE
Area-P 1/2: 2.37 cm2
S' Lateral: 3.1 cm

## 2024-04-20 ENCOUNTER — Other Ambulatory Visit: Payer: Self-pay | Admitting: Family Medicine

## 2024-04-23 ENCOUNTER — Other Ambulatory Visit: Payer: Self-pay | Admitting: Family Medicine

## 2024-05-08 NOTE — Progress Notes (Unsigned)
 Cardiology Office Note:    Date:  05/09/2024   ID:  George Brock, DOB 12-26-1965, MRN [id number]  PCP:  George Butler DASEN, MD  Cardiologist:  None  Electrophysiologist:  None   Referring MD: George Butler DASEN, MD   No chief complaint on file.   History of Present Illness:    George Brock is a 58 y.o. male with a hx of CAD, prior tobacco use, T2DM, morbid obesity who presents for follow-up.  He was referred by Dr. Duanne for evaluation of CAD, initially seen 02/28/2024.  Calcium  score on 11/30/2023 was 555 (90th percentile).  Previously followed with Dr. Francyne, last seen in 2017.  Had false positive nuclear stress test in 2015, underwent cardiac catheterization in 2015 which showed no significant CAD.  Echocardiogram 04/14/2024 showed mild systolic dysfunction (EF 45 to 50%, normal RV function, mild to moderate mitral regurgitation.  Since last clinic visit, he reports he is doing okay.  Denies any chest pain.  Continues to tire out with exertion.  Denies any lightheadedness or syncope.  Reports some lower extremity edema.  Past Medical History:  Diagnosis Date   Allergy    seasonal   Anxiety    work related   Asthma    Diabetes mellitus without complication (HCC)    Erectile dysfunction    GERD (gastroesophageal reflux disease)    Heart disease    2 blockages in his heart   Heart murmur    Hypertension    Hypogonadism in male    Tobacco abuse     Past Surgical History:  Procedure Laterality Date   CARDIOVASCULAR STRESS TEST     cardiolite    colonoscopy     LEFT HEART CATHETERIZATION WITH CORONARY ANGIOGRAM N/A 08/03/2014   Procedure: LEFT HEART CATHETERIZATION WITH CORONARY ANGIOGRAM;  Surgeon: Debby DELENA Sor, MD;  Location: [facility] CATH LAB;  Service: Cardiovascular;  Laterality: N/A;   US  ECHOCARDIOGRAPHY  06/07/2004    Current Medications: Current Meds  Medication Sig   albuterol  (VENTOLIN  HFA) 108 (90 Base) MCG/ACT inhaler Inhale 2 puffs into the lungs  every 6 (six) hours as needed for wheezing or shortness of breath.   aspirin  EC 81 MG tablet Take 1 tablet (81 mg total) by mouth daily. Swallow whole.   Blood Glucose Monitoring Suppl (ACCU-CHEK GUIDE) w/Device KIT    Blood Glucose Monitoring Suppl DEVI 1 each by Does not apply route in the morning, at noon, and at bedtime. May substitute to any manufacturer covered by patient's insurance.   carvedilol  (COREG ) 3.125 MG tablet Take 1 tablet (3.125 mg total) by mouth 2 (two) times daily.   Cetirizine HCl (ZYRTEC ALLERGY PO) Take 10 mg by mouth daily.   diclofenac  (VOLTAREN ) 75 MG EC tablet TAKE 1 TABLET(75 MG) BY MOUTH TWICE DAILY   enalapril  (VASOTEC ) 20 MG tablet TAKE 1 TABLET(20 MG) BY MOUTH DAILY   glucose blood (ACCU-CHEK GUIDE) test strip USE TO CHECK GLUCOSE THREE TIMES DAILY.(MORNING NOON AND BEDTIME)   JARDIANCE  25 MG TABS tablet TAKE 1 TABLET(25 MG) BY MOUTH DAILY BEFORE BREAKFAST   rosuvastatin  (CRESTOR ) 20 MG tablet Take 1 tablet (20 mg total) by mouth daily.   venlafaxine  (EFFEXOR ) 75 MG tablet TAKE 1 TABLET(75 MG) BY MOUTH TWICE DAILY WITH A MEAL     Allergies:   Codeine   Social History   Socioeconomic History   Marital status: Married    Spouse name: Etta   Number of children: 0   Years of education:  Not on file   Highest education level: Not on file  Occupational History   Occupation: Air cabin crew, Technical sales engineer  Tobacco Use   Smoking status: Former    Types: Cigarettes   Smokeless tobacco: Never  Vaping Use   Vaping status: Never Used  Substance and Sexual Activity   Alcohol use: Not Currently   Drug use: No   Sexual activity: Yes    Birth control/protection: None  Other Topics Concern   Not on file  Social History Narrative   Not on file   Social Drivers of Health   Financial Resource Strain: Not on file  Food Insecurity: Not on file  Transportation Needs: Not on file  Physical Activity: Not on file  Stress: Not on file  Social Connections:  Not on file     Family History: The patient's family history includes CAD in his mother; Diabetes in his mother; Heart disease in his mother; Hypertension in his father; Prostate cancer in his father. There is no history of Colon cancer or Esophageal cancer.  ROS:   Please see the history of present illness.     All other systems reviewed and are negative.  EKGs/Labs/Other Studies Reviewed:    The following studies were reviewed today:   EKG:   02/28/2024: Normal sinus rhythm, left bundle branch block, rate 66  Recent Labs: 11/19/2023: ALT 19; BUN 13; Creat 0.86; Hemoglobin 17.2; Platelets 218; Potassium 4.7; Sodium 137  Recent Lipid Panel    Component Value Date/Time   CHOL 87 (L) 03/07/2024 1552   TRIG 133 03/07/2024 1552   HDL 33 (L) 03/07/2024 1552   CHOLHDL 2.6 03/07/2024 1552   CHOLHDL 2.8 11/19/2023 0923   VLDL 12 11/20/2016 0919   LDLCALC 31 03/07/2024 1552   LDLCALC 64 11/19/2023 0923    Physical Exam:    VS:  BP 130/82 (BP Location: Right Arm, Patient Position: Sitting, Cuff Size: Large)   Pulse 62   Ht 6' (1.829 m)   Wt (!) 310 lb 3.2 oz (140.7 kg)   SpO2 95%   BMI 42.07 kg/m     Wt Readings from Last 3 Encounters:  05/09/24 (!) 310 lb 3.2 oz (140.7 kg)  02/28/24 (!) 305 lb (138.3 kg)  02/01/24 (!) 307 lb (139.3 kg)     GEN:  Well nourished, well developed in no acute distress HEENT: Normal NECK: No JVD; No carotid bruits CARDIAC: RRR, no murmurs, rubs, gallops RESPIRATORY:  Clear to auscultation without rales, wheezing or rhonchi  ABDOMEN: Soft, non-tender, non-distended MUSCULOSKELETAL:  No edema; No deformity  SKIN: Warm and dry NEUROLOGIC:  Alert and oriented x 3 PSYCHIATRIC:  Normal affect   ASSESSMENT:    1. Coronary artery disease involving native coronary artery of native heart, unspecified whether angina present   2. Chronic systolic heart failure (HCC)   3. Essential hypertension   4. Hyperlipidemia, unspecified hyperlipidemia type    5. Morbid obesity (HCC)   6. Snoring     PLAN:    CAD: Calcium  score on 11/30/2023 was 555 (90th percentile). Had false positive nuclear stress test in 2015, underwent cardiac catheterization in 2015 which showed no significant CAD. - Continue aspirin  81 mg daily - Continue rosuvastatin  20 mg daily - Denies any chest pain but does report dyspnea on exertion that could represent anginal equivalent.  He is not a treadmill candidate.  Recommend stress PET to evaluate for ischemia, scheduled for 9/3  Chronic systolic heart failure: Echocardiogram 04/14/2024 showed mild systolic  dysfunction (EF 45 to 50%), normal RV function, mild to moderate mitral regurgitation.  Need to rule ischemia, stress PET planned as above.  Could be due to left bundle branch block - Continue enalapril  20 mg daily - Continue Jardiance  - Add carvedilol  3.125 mg twice daily - Plan for stress PET to evaluate for ischemia as above.  If unremarkable will plan cardiac MRI for further evaluation  Hypertension: On enalapril  20 mg daily.  Add carvedilol  as above  Hyperlipidemia: On rosuvastatin  20 mg daily.  LDL 31 on 03/07/2024  T2DM: A1c 13.8% on 11/30/2022.  Has significantly improved, A1c 5.8% on 11/19/2023.  On Jardiance   Morbid obesity: Body mass index is 42.07 kg/m. Diet/exercise recommended  Snoring/daytime somnolence: Check Itamar sleep study.  STOP-BANG 6  RTC in 1 month    Medication Adjustments/Labs and Tests Ordered: Current medicines are reviewed at length with the patient today.  Concerns regarding medicines are outlined above.  No orders of the defined types were placed in this encounter.  Meds ordered this encounter  Medications   carvedilol  (COREG ) 3.125 MG tablet    Sig: Take 1 tablet (3.125 mg total) by mouth 2 (two) times daily.    Dispense:  180 tablet    Refill:  3    Patient Instructions  Medication Instructions:  Start Coreg  3.125 mg twice a day *If you need a refill on your cardiac  medications before your next appointment, please call your pharmacy*  Lab Work: none If you have labs (blood work) drawn today and your tests are completely normal, you will receive your results only by: MyChart Message (if you have MyChart) OR A paper copy in the mail If you have any lab test that is abnormal or we need to change your treatment, we will call you to review the results.  Testing/Procedures: none  Follow-Up: At [facility], you and your health needs are our priority.  As part of our continuing mission to provide you with exceptional heart care, our providers are all part of one team.  This team includes your primary Cardiologist (physician) and Advanced Practice Providers or APPs (Physician Assistants and Nurse Practitioners) who all work together to provide you with the care you need, when you need it.  Your next appointment:   Sept 10 @4 :20 pm at [place] location  Provider:   Dr. Kate  We recommend signing up for the patient portal called MyChart.  Sign up information is provided on this After Visit Summary.  MyChart is used to connect with patients for Virtual Visits (Telemedicine).  Patients are able to view lab/test results, encounter notes, upcoming appointments, etc.  Non-urgent messages can be sent to your provider as well.   To learn more about what you can do with MyChart, go to [email or web address].   Other Instructions None Someone will call you with Pin for Itamar please do not open it up until someone calls you       Signed, Lonni LITTIE Kate, MD  05/09/2024 11:24 AM    [facility]

## 2024-05-09 ENCOUNTER — Encounter: Payer: Self-pay | Admitting: Cardiology

## 2024-05-09 ENCOUNTER — Ambulatory Visit: Attending: Cardiology | Admitting: Cardiology

## 2024-05-09 VITALS — BP 130/82 | HR 62 | Ht 72.0 in | Wt 310.2 lb

## 2024-05-09 DIAGNOSIS — I2510 Atherosclerotic heart disease of native coronary artery without angina pectoris: Secondary | ICD-10-CM

## 2024-05-09 DIAGNOSIS — I5022 Chronic systolic (congestive) heart failure: Secondary | ICD-10-CM

## 2024-05-09 DIAGNOSIS — E785 Hyperlipidemia, unspecified: Secondary | ICD-10-CM

## 2024-05-09 DIAGNOSIS — R0683 Snoring: Secondary | ICD-10-CM

## 2024-05-09 DIAGNOSIS — I10 Essential (primary) hypertension: Secondary | ICD-10-CM | POA: Diagnosis not present

## 2024-05-09 MED ORDER — CARVEDILOL 3.125 MG PO TABS: 3.1250 mg | ORAL_TABLET | Freq: Two times a day (BID) | ORAL | 3 refills | Status: AC

## 2024-05-09 NOTE — Patient Instructions (Signed)
 Medication Instructions:  Start Coreg  3.125 mg twice a day *If you need a refill on your cardiac medications before your next appointment, please call your pharmacy*  Lab Work: none If you have labs (blood work) drawn today and your tests are completely normal, you will receive your results only by: MyChart Message (if you have MyChart) OR A paper copy in the mail If you have any lab test that is abnormal or we need to change your treatment, we will call you to review the results.  Testing/Procedures: none  Follow-Up: At [facility], you and your health needs are our priority.  As part of our continuing mission to provide you with exceptional heart care, our providers are all part of one team.  This team includes your primary Cardiologist (physician) and Advanced Practice Providers or APPs (Physician Assistants and Nurse Practitioners) who all work together to provide you with the care you need, when you need it.  Your next appointment:   Sept 10 @4 :20 pm at [facility] location  Provider:   Dr. Kate  We recommend signing up for the patient portal called MyChart.  Sign up information is provided on this After Visit Summary.  MyChart is used to connect with patients for Virtual Visits (Telemedicine).  Patients are able to view lab/test results, encounter notes, upcoming appointments, etc.  Non-urgent messages can be sent to your provider as well.   To learn more about what you can do with MyChart, go to [email or web address].   Other Instructions None Someone will call you with Pin for Itamar please do not open it up until someone calls you

## 2024-05-16 ENCOUNTER — Encounter (HOSPITAL_COMMUNITY): Payer: Self-pay

## 2024-05-20 ENCOUNTER — Telehealth (HOSPITAL_COMMUNITY): Payer: Self-pay | Admitting: Emergency Medicine

## 2024-05-20 NOTE — Telephone Encounter (Signed)
 Reaching out to patient to offer assistance regarding upcoming cardiac imaging study; pt verbalizes understanding of appt date/time, parking situation and where to check in, pre-test NPO status and medications ordered, and verified current allergies; name and call back number provided for further questions should they arise Rockwell Alexandria RN Navigator Cardiac Imaging [facility] [phone] office [phone] cell

## 2024-05-21 ENCOUNTER — Ambulatory Visit (HOSPITAL_COMMUNITY)
Admission: RE | Admit: 2024-05-21 | Discharge: 2024-05-21 | Disposition: A | Discharge status: Home / Self Care | Source: Ambulatory Visit | Attending: Cardiology | Admitting: Cardiology

## 2024-05-21 DIAGNOSIS — R0609 Other forms of dyspnea: Secondary | ICD-10-CM | POA: Diagnosis present

## 2024-05-21 LAB — NM PET CT CARDIAC PERFUSION MULTI W/ABSOLUTE BLOODFLOW
MBFR: 2.9
Nuc Rest EF: 53 %
Nuc Stress EF: 58 %
Peak HR: 80 {beats}/min
Rest HR: 58 {beats}/min
Rest MBF: 0.68 ml/g/min
Rest Nuclear Isotope Dose: 29.8 mCi
Rest perfusion cavity size (mL): 61 mL
ST Depression (mm): 0 mm
Stress MBF: 1.97 ml/g/min
Stress Nuclear Isotope Dose: 30.1 mCi
Stress perfusion cavity size (mL): 130 mL

## 2024-05-21 MED ORDER — RUBIDIUM RB82 GENERATOR (RUBYFILL)
30.0900 | PACK | Freq: Once | INTRAVENOUS | Status: AC
  Administered 2024-05-21: 30.09 via INTRAVENOUS

## 2024-05-21 MED ORDER — RUBIDIUM RB82 GENERATOR (RUBYFILL)
29.8100 | PACK | Freq: Once | INTRAVENOUS | Status: AC
  Administered 2024-05-21: 29.81 via INTRAVENOUS

## 2024-05-21 MED ORDER — REGADENOSON 0.4 MG/5ML IV SOLN
INTRAVENOUS | Status: AC
  Filled 2024-05-21: qty 5

## 2024-05-21 MED ORDER — REGADENOSON 0.4 MG/5ML IV SOLN
0.4000 mg | Freq: Once | INTRAVENOUS | Status: AC
  Administered 2024-05-21: 0.4 mg via INTRAVENOUS

## 2024-05-25 NOTE — Progress Notes (Unsigned)
 Cardiology Office Note:    Date:  05/25/2024   ID:  George Brock, DOB Nov 10, 1965, MRN [id number]  PCP:  George Butler DASEN, MD  Cardiologist:  None  Electrophysiologist:  None   Referring MD: George Butler DASEN, MD   No chief complaint on file.   History of Present Illness:    George Brock is a 58 y.o. male with a hx of CAD, prior tobacco use, T2DM, morbid obesity who presents for follow-up.  He was referred by Dr. Duanne for evaluation of CAD, initially seen 02/28/2024.  Calcium  score on 11/30/2023 was 555 (90th percentile).  Previously followed with Dr. Francyne, last seen in 2017.  Had false positive nuclear stress test in 2015, underwent cardiac catheterization in 2015 which showed no significant CAD.  Echocardiogram 04/14/2024 showed mild systolic dysfunction (EF 45 to 50%, normal RV function, mild to moderate mitral regurgitation.  Stress PET 05/21/2024 showed normal perfusion, normal myocardial blood flow reserve, LVEF 53%, moderate coronary calcifications.  Since last clinic visit,  he reports he is doing okay.  Denies any chest pain.  Continues to tire out with exertion.  Denies any lightheadedness or syncope.  Reports some lower extremity edema.  Past Medical History:  Diagnosis Date   Allergy    seasonal   Anxiety    work related   Asthma    Diabetes mellitus without complication (HCC)    Erectile dysfunction    GERD (gastroesophageal reflux disease)    Heart disease    2 blockages in his heart   Heart murmur    Hypertension    Hypogonadism in male    Tobacco abuse     Past Surgical History:  Procedure Laterality Date   CARDIOVASCULAR STRESS TEST     cardiolite    colonoscopy     LEFT HEART CATHETERIZATION WITH CORONARY ANGIOGRAM N/A 08/03/2014   Procedure: LEFT HEART CATHETERIZATION WITH CORONARY ANGIOGRAM;  Surgeon: Debby DELENA Sor, MD;  Location: [facility] CATH LAB;  Service: Cardiovascular;  Laterality: N/A;   US  ECHOCARDIOGRAPHY  06/07/2004    Current  Medications: No outpatient medications have been marked as taking for the 05/28/24 encounter (Appointment) with [name], MD.     Allergies:   Codeine   Social History   Socioeconomic History   Marital status: Married    Spouse name: Etta   Number of children: 0   Years of education: Not on file   Highest education level: Not on file  Occupational History   Occupation: Air cabin crew, Technical sales engineer  Tobacco Use   Smoking status: Former    Types: Cigarettes   Smokeless tobacco: Never  Vaping Use   Vaping status: Never Used  Substance and Sexual Activity   Alcohol use: Not Currently   Drug use: No   Sexual activity: Yes    Birth control/protection: None  Other Topics Concern   Not on file  Social History Narrative   Not on file   Social Drivers of Health   Financial Resource Strain: Not on file  Food Insecurity: Not on file  Transportation Needs: Not on file  Physical Activity: Not on file  Stress: Not on file  Social Connections: Not on file     Family History: The patient's family history includes CAD in his mother; Diabetes in his mother; Heart disease in his mother; Hypertension in his father; Prostate cancer in his father. There is no history of Colon cancer or Esophageal cancer.  ROS:   Please see the history  of present illness.     All other systems reviewed and are negative.  EKGs/Labs/Other Studies Reviewed:    The following studies were reviewed today:   EKG:   02/28/2024: Normal sinus rhythm, left bundle branch block, rate 66  Recent Labs: 11/19/2023: ALT 19; BUN 13; Creat 0.86; Hemoglobin 17.2; Platelets 218; Potassium 4.7; Sodium 137  Recent Lipid Panel    Component Value Date/Time   CHOL 87 (L) 03/07/2024 1552   TRIG 133 03/07/2024 1552   HDL 33 (L) 03/07/2024 1552   CHOLHDL 2.6 03/07/2024 1552   CHOLHDL 2.8 11/19/2023 0923   VLDL 12 11/20/2016 0919   LDLCALC 31 03/07/2024 1552   LDLCALC 64 11/19/2023 0923     Physical Exam:    VS:  There were no vitals taken for this visit.    Wt Readings from Last 3 Encounters:  05/09/24 (!) 310 lb 3.2 oz (140.7 kg)  02/28/24 (!) 305 lb (138.3 kg)  02/01/24 (!) 307 lb (139.3 kg)     GEN:  Well nourished, well developed in no acute distress HEENT: Normal NECK: No JVD; No carotid bruits CARDIAC: RRR, no murmurs, rubs, gallops RESPIRATORY:  Clear to auscultation without rales, wheezing or rhonchi  ABDOMEN: Soft, non-tender, non-distended MUSCULOSKELETAL:  No edema; No deformity  SKIN: Warm and dry NEUROLOGIC:  Alert and oriented x 3 PSYCHIATRIC:  Normal affect   ASSESSMENT:    No diagnosis found.   PLAN:    CAD: Calcium  score on 11/30/2023 was 555 (90th percentile). Had false positive nuclear stress test in 2015, underwent cardiac catheterization in 2015 which showed no significant CAD.  Stress PET 05/21/2024 showed normal perfusion, normal myocardial blood flow reserve, LVEF 53%, moderate coronary calcifications. - Continue aspirin  81 mg daily - Continue rosuvastatin  20 mg daily  Chronic systolic heart failure: Echocardiogram 04/14/2024 showed mild systolic dysfunction (EF 45 to 50%), normal RV function, mild to moderate mitral regurgitation.  No evidence of ischemia on stress PET 05/2024 as above.  Could be due to left bundle branch block - Continue enalapril  20 mg daily - Continue Jardiance  - Continue carvedilol  3.125 mg twice daily - Add spironolactone***  Hypertension: On enalapril  20 mg daily.  Add carvedilol  as above  Hyperlipidemia: On rosuvastatin  20 mg daily.  LDL 31 on 03/07/2024  T2DM: A1c 13.8% on 11/30/2022.  Has significantly improved, A1c 5.8% on 11/19/2023.  On Jardiance   Morbid obesity: There is no height or weight on file to calculate BMI. Diet/exercise recommended  Snoring/daytime somnolence: Check Itamar sleep study.  STOP-BANG 6  RTC in 1 month***    Medication Adjustments/Labs and Tests Ordered: Current medicines are  reviewed at length with the patient today.  Concerns regarding medicines are outlined above.  No orders of the defined types were placed in this encounter.  No orders of the defined types were placed in this encounter.   There are no Patient Instructions on file for this visit.   Signed, Lonni LITTIE Nanas, MD  05/25/2024 2:50 PM    [facility]

## 2024-05-28 ENCOUNTER — Ambulatory Visit (HOSPITAL_BASED_OUTPATIENT_CLINIC_OR_DEPARTMENT_OTHER): Admitting: Cardiology

## 2024-05-28 ENCOUNTER — Encounter (HOSPITAL_BASED_OUTPATIENT_CLINIC_OR_DEPARTMENT_OTHER): Payer: Self-pay | Admitting: Cardiology

## 2024-05-28 VITALS — BP 96/60 | HR 77 | Resp 17 | Ht 72.0 in | Wt 306.0 lb

## 2024-05-28 DIAGNOSIS — I5022 Chronic systolic (congestive) heart failure: Secondary | ICD-10-CM | POA: Diagnosis not present

## 2024-05-28 DIAGNOSIS — I2510 Atherosclerotic heart disease of native coronary artery without angina pectoris: Secondary | ICD-10-CM | POA: Diagnosis not present

## 2024-05-28 DIAGNOSIS — I10 Essential (primary) hypertension: Secondary | ICD-10-CM | POA: Diagnosis not present

## 2024-05-28 DIAGNOSIS — E785 Hyperlipidemia, unspecified: Secondary | ICD-10-CM

## 2024-05-28 MED ORDER — ENALAPRIL MALEATE 10 MG PO TABS: 10.0000 mg | ORAL_TABLET | Freq: Every day | ORAL | 3 refills | Status: AC

## 2024-05-28 NOTE — Patient Instructions (Addendum)
 Medication Instructions:  DECREASE ENALAPRIL  TO 10 MG DAILY   *If you need a refill on your cardiac medications before your next appointment, please call your pharmacy*  Lab Work: CBC/BMET SOON   If you have labs (blood work) drawn today and your tests are completely normal, you will receive your results only by: MyChart Message (if you have MyChart) OR A paper copy in the mail If you have any lab test that is abnormal or we need to change your treatment, we will call you to review the results.  Testing/Procedures: Your physician has requested that you have a cardiac MRI. Cardiac MRI uses a computer to create images of your heart as its beating, producing both still and moving pictures of your heart and major blood vessels. For further information please visit [email or web address]. Please follow the instruction sheet given to you today for more information.  Follow-Up: At [facility], you and your health needs are our priority.  As part of our continuing mission to provide you with exceptional heart care, our providers are all part of one team.  This team includes your primary Cardiologist (physician) and Advanced Practice Providers or APPs (Physician Assistants and Nurse Practitioners) who all work together to provide you with the care you need, when you need it.  Your next appointment:   3 MONTHS WITH DR [facility] OR APP   We recommend signing up for the patient portal called MyChart.  Sign up information is provided on this After Visit Summary.  MyChart is used to connect with patients for Virtual Visits (Telemedicine).  Patients are able to view lab/test results, encounter notes, upcoming appointments, etc.  Non-urgent messages can be sent to your provider as well.   To learn more about what you can do with MyChart, go to [email or web address].   Other Instructions Cardiac MRI (magnetic resonance imaging) is a painless, noninvasive test that creates detailed pictures of  organs and tissues. It is non-invasive and generates moving pictures of your heart and major blood vessels. Doctors are able to utilize a cardiac MRI to view a patient's beating heart and function.  MRI uses radio waves, magnets, and a computer to create pictures of your organs and tissues. Unlike other imaging tests, MRI doesn't use ionizing radiation or carry any risk of causing cancer.  It's used to diagnose and assess many diseases and conditions, including:  Coronary heart disease Heart failure or valve problems Damage caused by a heart attack Congenital heart defects (heart defects present at birth) Pericarditis (a condition in which the membrane, or sac, around your heart is inflamed) Cardiac tumors For an MRI test, the area of the body being studied is placed inside a special machine that takes digital images that can be saved and reviewed remotely for future reference. In some cases, dyes may be used intravenously during the MRI scan to show certain structures more clearly.

## 2024-06-02 ENCOUNTER — Encounter (HOSPITAL_COMMUNITY): Payer: Self-pay

## 2024-06-04 ENCOUNTER — Ambulatory Visit (HOSPITAL_COMMUNITY)
Admission: RE | Admit: 2024-06-04 | Discharge: 2024-06-04 | Disposition: A | Discharge status: Home / Self Care | Source: Ambulatory Visit | Attending: Cardiology | Admitting: Cardiology

## 2024-06-04 ENCOUNTER — Other Ambulatory Visit (HOSPITAL_BASED_OUTPATIENT_CLINIC_OR_DEPARTMENT_OTHER): Payer: Self-pay | Admitting: Cardiology

## 2024-06-04 DIAGNOSIS — I5022 Chronic systolic (congestive) heart failure: Secondary | ICD-10-CM

## 2024-06-04 MED ORDER — GADOBUTROL 1 MMOL/ML IV SOLN
10.0000 mL | Freq: Once | INTRAVENOUS | Status: AC | PRN
  Administered 2024-06-04: 10 mL via INTRAVENOUS

## 2024-06-06 ENCOUNTER — Ambulatory Visit: Payer: Self-pay | Admitting: Cardiology

## 2024-06-11 ENCOUNTER — Telehealth: Payer: Self-pay

## 2024-06-11 NOTE — Telephone Encounter (Signed)
 Called patient and patient verbalized he has no questions about  George Brock sleep study. Made patient aware that lab orders were placed per Dr. Kate. Made patient aware that lab hours are from 8-4pm. Patient verbalized an understanding.

## 2024-06-11 NOTE — Telephone Encounter (Signed)
 Ordering provider: Lonni Nanas, MD Associated diagnoses: Snoring WatchPAT PA obtained on 06/11/2024 by Lucie DELENA Ku, CMA. Authorization: No prior Authorization required per [employer]/ [employer] Managed Patient notified of PIN (1234) on 06/11/2024 via Notification Method: MyChart message.  Phone note routed to covering staff for follow-up.

## 2024-06-13 LAB — BASIC METABOLIC PANEL WITH GFR
BUN/Creatinine Ratio: 27 — ABNORMAL HIGH (ref 10–24)
BUN: 26 mg/dL (ref 8–27)
CO2: 21 mmol/L (ref 20–29)
Calcium: 9.2 mg/dL (ref 8.6–10.2)
Chloride: 100 mmol/L (ref 96–106)
Creatinine, Ser: 0.95 mg/dL (ref 0.76–1.27)
Glucose: 80 mg/dL (ref 70–99)
Potassium: 4.2 mmol/L (ref 3.5–5.2)
Sodium: 136 mmol/L (ref 134–144)
eGFR: 92 mL/min/1.73 (ref >59)

## 2024-06-13 LAB — CBC WITH DIFFERENTIAL/PLATELET
Basophils Absolute: 0.1 x10E3/uL (ref 0.0–0.2)
Basos: 1 %
EOS (ABSOLUTE): 0.1 x10E3/uL (ref 0.0–0.4)
Eos: 2 %
Hematocrit: 46.6 % (ref 37.5–51.0)
Hemoglobin: 15.5 g/dL (ref 13.0–17.7)
Immature Grans (Abs): 0 x10E3/uL (ref 0.0–0.1)
Immature Granulocytes: 0 %
Lymphocytes Absolute: 1.7 x10E3/uL (ref 0.7–3.1)
Lymphs: 20 %
MCH: 31.5 pg (ref 26.6–33.0)
MCHC: 33.3 g/dL (ref 31.5–35.7)
MCV: 95 fL (ref 79–97)
Monocytes Absolute: 0.7 x10E3/uL (ref 0.1–0.9)
Monocytes: 8 %
Neutrophils Absolute: 6 x10E3/uL (ref 1.4–7.0)
Neutrophils: 68 %
Platelets: 205 x10E3/uL (ref 150–450)
RBC: 4.92 x10E6/uL (ref 4.14–5.80)
RDW: 12.8 % (ref 11.6–15.4)
WBC: 8.6 x10E3/uL (ref 3.4–10.8)

## 2024-06-21 ENCOUNTER — Encounter (HOSPITAL_BASED_OUTPATIENT_CLINIC_OR_DEPARTMENT_OTHER): Payer: Self-pay | Admitting: Cardiology

## 2024-06-21 DIAGNOSIS — G4733 Obstructive sleep apnea (adult) (pediatric): Secondary | ICD-10-CM | POA: Diagnosis not present

## 2024-07-03 ENCOUNTER — Ambulatory Visit: Attending: Cardiology

## 2024-07-03 DIAGNOSIS — R0683 Snoring: Secondary | ICD-10-CM

## 2024-07-03 NOTE — Procedures (Signed)
 SLEEP STUDY REPORT Patient Information Study Date: 06/21/2024 Patient Name: George Brock Patient ID: [id number] Birth Date: 12/14/1965 Age: 58 Gender: Male BMI: 41.2 (W=304 lb, H=6' 0'') Stopbang: 6 Referring Physician: Lonni Nanas, MD  TEST DESCRIPTION: Home sleep apnea testing was completed using the WatchPat, a Type 1 device, utilizing  peripheral arterial tonometry (PAT), chest movement, actigraphy, pulse oximetry, pulse rate, body position and snore.  AHI was calculated with apnea and hypopnea using valid sleep time as the denominator. RDI includes apneas,  hypopneas, and RERAs. The data acquired and the scoring of sleep and all associated events were performed in  accordance with the recommended standards and specifications as outlined in the AASM Manual for the Scoring of  Sleep and Associated Events 2.2.0 (2015). FINDINGS:  1. Severe Obstructive Sleep Apnea with AHI 30.3/hr.   2. No Central Sleep Apnea with pAHIc 0.8/hr.  3. Oxygen desaturations as low as 79%.  4. Severe snoring was present. O2 sats were < 88% for 25 min.  5. Total sleep time was 6 hrs and 34 min.  6. 20% of total sleep time was spent in REM sleep.   7. Normal sleep onset latency at 22 min.   8. Shortened REM sleep onset latency at 46 min.   9. Total awakenings were 11.  10. Arrhythmia detection: Suggestive of possible brief atrial fibrillation lasting 3 min 39 seconds. This is not diagnostic  and further testing with outpatient telemetry monitoring is recommended.  DIAGNOSIS:  Severe Obstructive Sleep Apnea (G47.33) Nocturnal Hypoxemia Possible Atrial Fibrillation  RECOMMENDATIONS: 1. Clinical correlation of these findings is necessary. The decision to treat obstructive sleep apnea (OSA) is usually  based on the presence of apnea symptoms or the presence of associated medical conditions such as Hypertension,  Congestive Heart Failure, Atrial Fibrillation or Obesity. The most common  symptoms of OSA are snoring, gasping for  breath while sleeping, daytime sleepiness and fatigue.  2. Initiating apnea therapy is recommended given the presence of symptoms and/or associated conditions.  Recommend proceeding with one of the following:  a. Auto-CPAP therapy with a pressure range of 5-20cm H2O.  b. An oral appliance (OA) that can be obtained from certain dentists with expertise in sleep medicine. These are  primarily of use in non-obese patients with mild and moderate disease.  c. An ENT consultation which may be useful to look for specific causes of obstruction and possible treatment  options.  d. If patient is intolerant to PAP therapy, consider referral to ENT for evaluation for hypoglossal nerve stimulator.  3. Close follow-up is necessary to ensure success with CPAP or oral appliance therapy for maximum benefit . 4. A follow-up oximetry study on CPAP is recommended to assess the adequacy of therapy and determine the need  for supplemental oxygen or the potential need for Bi-level therapy. An arterial blood gas to determine the adequacy of  baseline ventilation and oxygenation should also be considered. 5. Healthy sleep recommendations include: adequate nightly sleep (normal 7-9 hrs/night), avoidance of caffeine after  noon and alcohol near bedtime, and maintaining a sleep environment that is cool, dark and quiet. 6. Weight loss for overweight patients is recommended. Even modest amounts of weight loss can significantly  improve the severity of sleep apnea. 7. Snoring recommendations include: weight loss where appropriate, side sleeping, and avoidance of alcohol before  bed. 8. Operation of motor vehicle should be avoided when sleepy.  Signature: Wilbert Bihari, MD; [facility]; Diplomat, American Board of Sleep  Medicine Electronically Signed: 07/03/2024 12:42:59 PM

## 2024-07-09 ENCOUNTER — Telehealth: Payer: Self-pay | Admitting: *Deleted

## 2024-07-09 NOTE — Telephone Encounter (Signed)
-----   Message from Wilbert Bihari sent at 07/03/2024 12:44 PM EDT ----- Please let patient know that they have sleep apnea.  Recommend therapeutic CPAP titration for treatment of patient's sleep disordered breathing.

## 2024-07-09 NOTE — Telephone Encounter (Signed)
 Pt return call  Best number [phone]

## 2024-07-09 NOTE — Telephone Encounter (Signed)
 Called results no voicemail set up.

## 2024-07-10 ENCOUNTER — Ambulatory Visit: Attending: Cardiology

## 2024-07-10 DIAGNOSIS — I4891 Unspecified atrial fibrillation: Secondary | ICD-10-CM

## 2024-07-10 NOTE — Addendum Note (Signed)
 Addended by: TRUDY FRANKEY SAUNDERS on: 07/10/2024 04:10 PM   Modules accepted: Orders

## 2024-07-10 NOTE — Telephone Encounter (Signed)
 Called patient and results given per Dr. Kate. Made patient aware that monitor showed possible Afib seen during sleep study and Dr. Kate recommend zio x 2 weeks for further evaluation. Pt verbalized an understanding.

## 2024-07-10 NOTE — Progress Notes (Unsigned)
 Enrolled patient for a 14 day Zio XT  monitor to be mailed to patients home

## 2024-07-11 ENCOUNTER — Telehealth: Payer: Self-pay | Admitting: *Deleted

## 2024-07-11 DIAGNOSIS — R0683 Snoring: Secondary | ICD-10-CM

## 2024-07-11 DIAGNOSIS — I2510 Atherosclerotic heart disease of native coronary artery without angina pectoris: Secondary | ICD-10-CM

## 2024-07-11 DIAGNOSIS — I10 Essential (primary) hypertension: Secondary | ICD-10-CM

## 2024-07-11 DIAGNOSIS — G4733 Obstructive sleep apnea (adult) (pediatric): Secondary | ICD-10-CM

## 2024-07-11 DIAGNOSIS — I5022 Chronic systolic (congestive) heart failure: Secondary | ICD-10-CM

## 2024-07-11 NOTE — Telephone Encounter (Signed)
**Note De-Identified George Brock Obfuscation** I called the pt and advised him of his sleep results and George Brock recommendation to have a CPAP Titration as follows: From: George Wilbert SAUNDERS, MD On: 07/03/2024 12:44 PM  To: Cv Div Sleep Studies (Pool)  Priority: Routine  Routing Comments:  Please let patient know that they have sleep apnea.  Recommend therapeutic CPAP titration for treatment of patient's sleep disordered breathing.   The pt stated that there is no way he can do an in-lab CPAP Titration as his wife is very sick and he is her primary caregiver and that he works 8 to 14 hours a day. He wants to know if there is a titration/test that he can do at home that will give us  results similar to a CPAP titration.  I advised him that I am forwarding this message to George Brock and George George for their advisement and that I will call him back once I hear back from them.  He verbalized understanding and thanked me for calling him back.

## 2024-07-11 NOTE — Telephone Encounter (Signed)
 Called pt and ask if he  had used a CPAP or if anyone had reached out to him about using a CPAP.  Per Dr. Kate made pt aware Verneda shows severe OSA and the office will touch  base with sleep coordinator, is he being started on CPAP. Pt verbalized he sleep well and works all day. Made pt aware that this has been forward to sleep study team. Pt verbalized an understanding.

## 2024-07-14 NOTE — Telephone Encounter (Signed)
**Note De-Identified Marcelle Bebout Obfuscation** See phone note from 10/24 for update.

## 2024-07-20 ENCOUNTER — Other Ambulatory Visit: Payer: Self-pay | Admitting: Family Medicine

## 2024-07-22 NOTE — Progress Notes (Unsigned)
 Cardiology Office Note:    Date:  07/24/2024   ID:  George Brock, DOB Mar 22, 1966, MRN [id number]  PCP:  [name], MD  Cardiologist:  Lonni LITTIE Nanas, MD  Electrophysiologist:  None   Referring MD: [name], MD   Chief Complaint  Patient presents with   Congestive Heart Failure    History of Present Illness:    George Brock is a 58 y.o. male with a hx of CAD, prior tobacco use, T2DM, morbid obesity who presents for follow-up.  He was referred by Dr. Duanne for evaluation of CAD, initially seen 02/28/2024.  Calcium  score on 11/30/2023 was 555 (90th percentile).  Previously followed with Dr. Francyne, last seen in 2017.  Had false positive nuclear stress test in 2015, underwent cardiac catheterization in 2015 which showed no significant CAD.  Echocardiogram 04/14/2024 showed mild systolic dysfunction (EF 45 to 50%, normal RV function, mild to moderate mitral regurgitation.  Stress PET 05/21/2024 showed normal perfusion, normal myocardial blood flow reserve, LVEF 53%, moderate coronary calcifications.  Cardiac MRI 06/05/2024 showed LVEF 45%, dyssynchrony consistent with left bundle branch block, RVEF 52%, no LGE.  Since last clinic visit, he reports he is doing well.  Denies any chest pain, dyspnea,  lower extremity edema.  Does report intermittent palpitations. Some lightheadedness but no syncope.   BP Readings from Last 3 Encounters:  07/24/24 108/84  05/28/24 96/60  05/21/24 116/62       Past Medical History:  Diagnosis Date   Allergy    seasonal   Anxiety    work related   Asthma    Diabetes mellitus without complication (HCC)    Erectile dysfunction    GERD (gastroesophageal reflux disease)    Heart disease    2 blockages in his heart   Heart murmur    Hypertension    Hypogonadism in male    Tobacco abuse     Past Surgical History:  Procedure Laterality Date   CARDIOVASCULAR STRESS TEST     cardiolite    colonoscopy     LEFT HEART  CATHETERIZATION WITH CORONARY ANGIOGRAM N/A 08/03/2014   Procedure: LEFT HEART CATHETERIZATION WITH CORONARY ANGIOGRAM;  Surgeon: Debby DELENA Sor, MD;  Location: [facility] CATH LAB;  Service: Cardiovascular;  Laterality: N/A;   US  ECHOCARDIOGRAPHY  06/07/2004    Current Medications: Current Meds  Medication Sig   albuterol  (VENTOLIN  HFA) 108 (90 Base) MCG/ACT inhaler Inhale 2 puffs into the lungs every 6 (six) hours as needed for wheezing or shortness of breath.   aspirin  EC 81 MG tablet Take 1 tablet (81 mg total) by mouth daily. Swallow whole.   Blood Glucose Monitoring Suppl (ACCU-CHEK GUIDE) w/Device KIT    Blood Glucose Monitoring Suppl DEVI 1 each by Does not apply route in the morning, at noon, and at bedtime. May substitute to any manufacturer covered by patient's insurance.   carvedilol  (COREG ) 3.125 MG tablet Take 1 tablet (3.125 mg total) by mouth 2 (two) times daily.   Cetirizine HCl (ZYRTEC ALLERGY PO) Take 10 mg by mouth daily.   diclofenac  (VOLTAREN ) 75 MG EC tablet TAKE 1 TABLET(75 MG) BY MOUTH TWICE DAILY   enalapril  (VASOTEC ) 10 MG tablet Take 1 tablet (10 mg total) by mouth daily.   glucose blood (ACCU-CHEK GUIDE) test strip USE TO CHECK GLUCOSE THREE TIMES DAILY.(MORNING NOON AND BEDTIME)   JARDIANCE  25 MG TABS tablet TAKE 1 TABLET(25 MG) BY MOUTH DAILY BEFORE BREAKFAST   rosuvastatin  (CRESTOR ) 20 MG tablet  Take 1 tablet (20 mg total) by mouth daily.   sildenafil  (VIAGRA ) 100 MG tablet Take 50 mg by mouth daily as needed for erectile dysfunction.   spironolactone (ALDACTONE) 25 MG tablet Take 0.5 tablets (12.5 mg total) by mouth daily.   [DISCONTINUED] venlafaxine  (EFFEXOR ) 75 MG tablet TAKE 1 TABLET(75 MG) BY MOUTH TWICE DAILY WITH A MEAL     Allergies:   Codeine and Effexor  [venlafaxine ]   Social History   Socioeconomic History   Marital status: Married    Spouse name: Etta   Number of children: 0   Years of education: Not on file   Highest education level: Not on  file  Occupational History   Occupation: [employer], Technical Sales Engineer  Tobacco Use   Smoking status: Former    Current packs/day: 0.00    Types: Cigarettes    Quit date: 2023    Years since quitting: 2.8    Passive exposure: Current (Every day and a lot in the past as well)   Smokeless tobacco: Never  Vaping Use   Vaping status: Never Used  Substance and Sexual Activity   Alcohol use: Not Currently   Drug use: No   Sexual activity: Yes    Birth control/protection: None  Other Topics Concern   Not on file  Social History Narrative   Not on file   Social Drivers of Health   Financial Resource Strain: Not on file  Food Insecurity: No Food Insecurity (07/24/2024)   Hunger Vital Sign    Worried About Running Out of Food in the Last Year: Never true    Ran Out of Food in the Last Year: Never true  Transportation Needs: Not on file  Physical Activity: Not on file  Stress: Not on file  Social Connections: Not on file     Family History: The patient's family history includes CAD in his mother; Diabetes in his mother; Heart disease in his mother; Hypertension in his father; Prostate cancer in his father. There is no history of Colon cancer or Esophageal cancer.  ROS:   Please see the history of present illness.     All other systems reviewed and are negative.  EKGs/Labs/Other Studies Reviewed:    The following studies were reviewed today:   EKG:   02/28/2024: Normal sinus rhythm, left bundle branch block, rate 66  Recent Labs: 11/19/2023: ALT 19 06/13/2024: BUN 26; Creatinine, Ser 0.95; Hemoglobin 15.5; Platelets 205; Potassium 4.2; Sodium 136  Recent Lipid Panel    Component Value Date/Time   CHOL 87 (L) 03/07/2024 1552   TRIG 133 03/07/2024 1552   HDL 33 (L) 03/07/2024 1552   CHOLHDL 2.6 03/07/2024 1552   CHOLHDL 2.8 11/19/2023 0923   VLDL 12 11/20/2016 0919   LDLCALC 31 03/07/2024 1552   LDLCALC 64 11/19/2023 0923    Physical Exam:    VS:  BP 108/84 (BP  Location: Right Arm, Patient Position: Sitting, Cuff Size: Large)   Pulse 62   Ht 6' (1.829 m)   Wt (!) 312 lb 6.4 oz (141.7 kg)   SpO2 94%   BMI 42.37 kg/m     Wt Readings from Last 3 Encounters:  07/24/24 (!) 312 lb 6.4 oz (141.7 kg)  05/28/24 (!) 306 lb (138.8 kg)  05/09/24 (!) 310 lb 3.2 oz (140.7 kg)     GEN:  Well nourished, well developed in no acute distress HEENT: Normal NECK: No JVD; No carotid bruits CARDIAC: RRR, no murmurs, rubs, gallops RESPIRATORY:  Clear  to auscultation without rales, wheezing or rhonchi  ABDOMEN: Soft, non-tender, non-distended MUSCULOSKELETAL:  No edema; No deformity  SKIN: Warm and dry NEUROLOGIC:  Alert and oriented x 3 PSYCHIATRIC:  Normal affect   ASSESSMENT:    1. Chronic systolic heart failure (HCC)   2. Coronary artery disease involving native coronary artery of native heart without angina pectoris   3. Morbid obesity (HCC)   4. OSA (obstructive sleep apnea)   5. Essential hypertension   6. Hyperlipidemia, unspecified hyperlipidemia type      PLAN:     Chronic systolic heart failure: Echocardiogram 04/14/2024 showed mild systolic dysfunction (EF 45 to 50%), normal RV function, mild to moderate mitral regurgitation.  No evidence of ischemia on stress PET 05/2024 as above.  Cardiac MRI 06/05/2024 showed LVEF 45%, dyssynchrony consistent with left bundle branch block, RVEF 52%, no LGE.  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  Check BMET in 1 week  CAD: Calcium  score on 11/30/2023 was 555 (90th percentile). Had false positive nuclear stress test in 2015, underwent cardiac catheterization in 2015 which showed no significant CAD.  Stress PET 05/21/2024 showed normal perfusion, normal myocardial blood flow reserve, LVEF 53%, moderate coronary calcifications. - Continue aspirin  81 mg daily -  Continue rosuvastatin  20 mg daily  Possible A-fib: Noted on sleep study 06/2024, Zio patch x 2 weeks recommended.  He is currently wearing Zio patch, will follow-up results  Hypertension: On enalapril  10 mg daily and carvedilol  3.125 mg twice daily.  Add spironolactone as above  Hyperlipidemia: On rosuvastatin  20 mg daily.  LDL 31 on 03/07/2024  T2DM: A1c 13.8% on 11/30/2022.  Has significantly improved, A1c 5.8% on 11/19/2023.  On Jardiance   Morbid obesity: Body mass index is 42.37 kg/m. Diet/exercise recommended  OSA: Severe OSA on sleep study 06/2024.  He declined CPAP titration study initially.  We discussed importance of treating his OSA, he is agreeable with proceeding with CPAP titration study now, will reach out to sleep coordinator  RTC in 4 months    Medication Adjustments/Labs and Tests Ordered: Current medicines are reviewed at length with the patient today.  Concerns regarding medicines are outlined above.  Orders Placed This Encounter  Procedures   Basic Metabolic Panel (BMET)   Meds ordered this encounter  Medications   spironolactone (ALDACTONE) 25 MG tablet    Sig: Take 0.5 tablets (12.5 mg total) by mouth daily.    Dispense:  45 tablet    Refill:  3    Patient Instructions  Medication Instructions:  Your physician has recommended you make the following change in your medication:  1.) start spironolactone (Aldactone) 25 mg - take HALF TABLET (12.5 MG) DAILY  *If you need a refill on your cardiac medications before your next appointment, please call your pharmacy*  Lab Work: Return in about 7-10 days for blood work (bmp)  Testing/Procedures: none  Follow-Up: At [facility], you and your health needs are our priority.  As part of our continuing mission to provide you with exceptional heart care, our providers are all part of one team.  This team includes your primary Cardiologist (physician) and Advanced Practice Providers or APPs (Physician  Assistants and Nurse Practitioners) who all work together to provide you with the care you need, when you need it.  Your next appointment:   4 month(s)  Provider:  Lonni Nanas, MD  ADDENDUM: MESSAGE SENT TO SLEEP DEPARTMENT TO CALL YOU ABOUT CPAP TITRATION     Signed, Lonni LITTIE Nanas, MD  07/24/2024 8:43 AM    [facility]

## 2024-07-24 ENCOUNTER — Encounter (HOSPITAL_BASED_OUTPATIENT_CLINIC_OR_DEPARTMENT_OTHER): Payer: Self-pay | Admitting: Cardiology

## 2024-07-24 ENCOUNTER — Ambulatory Visit (HOSPITAL_BASED_OUTPATIENT_CLINIC_OR_DEPARTMENT_OTHER): Admitting: Cardiology

## 2024-07-24 VITALS — BP 108/84 | HR 62 | Ht 72.0 in | Wt 312.4 lb

## 2024-07-24 DIAGNOSIS — I2510 Atherosclerotic heart disease of native coronary artery without angina pectoris: Secondary | ICD-10-CM | POA: Diagnosis not present

## 2024-07-24 DIAGNOSIS — I5022 Chronic systolic (congestive) heart failure: Secondary | ICD-10-CM

## 2024-07-24 DIAGNOSIS — G4733 Obstructive sleep apnea (adult) (pediatric): Secondary | ICD-10-CM | POA: Diagnosis not present

## 2024-07-24 DIAGNOSIS — I10 Essential (primary) hypertension: Secondary | ICD-10-CM

## 2024-07-24 DIAGNOSIS — E785 Hyperlipidemia, unspecified: Secondary | ICD-10-CM

## 2024-07-24 MED ORDER — SPIRONOLACTONE 25 MG PO TABS: 12.5000 mg | ORAL_TABLET | Freq: Every day | ORAL | 3 refills | Status: AC

## 2024-07-24 NOTE — Patient Instructions (Addendum)
 Medication Instructions:  Your physician has recommended you make the following change in your medication:  1.) start spironolactone (Aldactone) 25 mg - take HALF TABLET (12.5 MG) DAILY  *If you need a refill on your cardiac medications before your next appointment, please call your pharmacy*  Lab Work: Return in about 7-10 days for blood work (bmp)  Testing/Procedures: none  Follow-Up: At [facility], you and your health needs are our priority.  As part of our continuing mission to provide you with exceptional heart care, our providers are all part of one team.  This team includes your primary Cardiologist (physician) and Advanced Practice Providers or APPs (Physician Assistants and Nurse Practitioners) who all work together to provide you with the care you need, when you need it.  Your next appointment:   4 month(s)  Provider:   Lonni Nanas, MD  ADDENDUM: MESSAGE SENT TO SLEEP DEPARTMENT TO CALL YOU ABOUT CPAP TITRATION

## 2024-07-25 NOTE — Telephone Encounter (Signed)
 The patient has been notified of the result and verbalized understanding.  All questions (if any) were answered. George Brock, CMA 07/25/2024 4:13 PM    Upon patient request DME selection is [facility]. Patient understands he will be contacted by [facility] to set up his cpap. Patient understands to call if [facility] does not contact him with new setup in a timely manner. Patient understands they will be called once confirmation has been received from [facility] that they have received their new machine to schedule 10 week follow up appointment.   [facility] notified of new cpap order  Please add to airview Patient was grateful for the call and thanked me.

## 2024-08-01 NOTE — Telephone Encounter (Signed)
 The patient has been notified of the result and verbalized understanding.  All questions (if any) were answered. Joshua Dalton Seip, CMA 07/25/2024 4:13 PM

## 2024-08-02 LAB — BASIC METABOLIC PANEL WITH GFR
BUN/Creatinine Ratio: 26 — ABNORMAL HIGH (ref 10–24)
BUN: 23 mg/dL (ref 8–27)
CO2: 24 mmol/L (ref 20–29)
Calcium: 9.3 mg/dL (ref 8.6–10.2)
Chloride: 99 mmol/L (ref 96–106)
Creatinine, Ser: 0.9 mg/dL (ref 0.76–1.27)
Glucose: 95 mg/dL (ref 70–99)
Potassium: 4.7 mmol/L (ref 3.5–5.2)
Sodium: 136 mmol/L (ref 134–144)
eGFR: 98 mL/min/1.73 (ref >59)

## 2024-08-04 ENCOUNTER — Ambulatory Visit: Payer: Self-pay | Admitting: Cardiology

## 2024-08-11 ENCOUNTER — Ambulatory Visit: Payer: Self-pay | Admitting: Cardiology

## 2024-08-11 DIAGNOSIS — I4729 Other ventricular tachycardia: Secondary | ICD-10-CM

## 2024-08-11 DIAGNOSIS — I4891 Unspecified atrial fibrillation: Secondary | ICD-10-CM | POA: Diagnosis not present

## 2024-08-12 ENCOUNTER — Telehealth: Payer: Self-pay | Admitting: *Deleted

## 2024-08-12 NOTE — Telephone Encounter (Signed)
 The patient has been notified of the result and verbalized understanding.  All questions (if any) were answered. Joshua Dalton Seip, CMA 07/25/2024 4:13 PM    Upon patient request DME selection is [facility]. Patient understands he will be contacted by [facility] to set up his cpap. Patient understands to call if [facility] does not contact him with new setup in a timely manner. Patient understands they will be called once confirmation has been received from [facility] that they have received their new machine to schedule 10 week follow up appointment.   [facility] notified of new cpap order  Please add to airview Patient was grateful for the call and thanked me.

## 2024-08-12 NOTE — Telephone Encounter (Signed)
-----   Message from Wilbert Bihari sent at 07/03/2024 12:44 PM EDT ----- Please let patient know that they have sleep apnea.  Recommend therapeutic CPAP titration for treatment of patient's sleep disordered breathing.

## 2024-09-15 ENCOUNTER — Ambulatory Visit (HOSPITAL_BASED_OUTPATIENT_CLINIC_OR_DEPARTMENT_OTHER): Admitting: Cardiology

## 2024-09-19 ENCOUNTER — Ambulatory Visit: Admitting: Cardiology

## 2024-10-17 ENCOUNTER — Other Ambulatory Visit: Payer: Self-pay | Admitting: Family Medicine

## 2024-10-20 ENCOUNTER — Telehealth: Payer: Self-pay

## 2024-10-20 ENCOUNTER — Ambulatory Visit: Admitting: Cardiology

## 2024-10-20 ENCOUNTER — Encounter: Payer: Self-pay | Admitting: Cardiology

## 2024-10-20 VITALS — BP 101/68 | HR 65 | Ht 72.0 in | Wt 320.6 lb

## 2024-10-20 DIAGNOSIS — G4733 Obstructive sleep apnea (adult) (pediatric): Secondary | ICD-10-CM | POA: Diagnosis not present

## 2024-10-20 DIAGNOSIS — I10 Essential (primary) hypertension: Secondary | ICD-10-CM

## 2024-10-20 NOTE — Telephone Encounter (Signed)
 George Brock

## 2024-10-20 NOTE — Patient Instructions (Signed)
 Medication Instructions:  Your physician recommends that you continue on your current medications as directed. Please refer to the Current Medication list given to you today.  *If you need a refill on your cardiac medications before your next appointment, please call your pharmacy*  Lab Work: None ordered If you have labs (blood work) drawn today and your tests are completely normal, you will receive your results only by: MyChart Message (if you have MyChart) OR A paper copy in the mail If you have any lab test that is abnormal or we need to change your treatment, we will call you to review the results.  Testing/Procedures: None ordered  Follow-Up: At [facility], you and your health needs are our priority.  As part of our continuing mission to provide you with exceptional heart care, our providers are all part of one team.  This team includes your primary Cardiologist (physician) and Advanced Practice Providers or APPs (Physician Assistants and Nurse Practitioners) who all work together to provide you with the care you need, when you need it.  Your next appointment:   1 year(s)  Provider:   Wilbert Bihari, MD   We recommend signing up for the patient portal called MyChart.  Sign up information is provided on this After Visit Summary.  MyChart is used to connect with patients for Virtual Visits (Telemedicine).  Patients are able to view lab/test results, encounter notes, upcoming appointments, etc.  Non-urgent messages can be sent to your provider as well.   To learn more about what you can do with MyChart, go to [email or web address].

## 2024-11-03 ENCOUNTER — Ambulatory Visit: Admitting: Cardiology

## 2024-11-19 ENCOUNTER — Ambulatory Visit (HOSPITAL_BASED_OUTPATIENT_CLINIC_OR_DEPARTMENT_OTHER): Admitting: Cardiology

## 2025-01-09 ENCOUNTER — Encounter: Admitting: Family Medicine
